# Patient Record
Sex: Female | Born: 1980 | Race: White | Hispanic: No | Marital: Single | Smoking: Current every day smoker
Health system: Southern US, Community
[De-identification: ages and names within clinical notes are randomized; demographics above are authoritative.]

## PROBLEM LIST (undated history)

## (undated) DIAGNOSIS — M25569 Pain in unspecified knee: Secondary | ICD-10-CM

## (undated) DIAGNOSIS — K219 Gastro-esophageal reflux disease without esophagitis: Secondary | ICD-10-CM

## (undated) DIAGNOSIS — G8929 Other chronic pain: Secondary | ICD-10-CM

## (undated) DIAGNOSIS — M069 Rheumatoid arthritis, unspecified: Secondary | ICD-10-CM

## (undated) DIAGNOSIS — R011 Cardiac murmur, unspecified: Secondary | ICD-10-CM

## (undated) HISTORY — PX: CHOLECYSTECTOMY: SHX55

## (undated) HISTORY — PX: ABDOMINAL HYSTERECTOMY: SHX81

## (undated) HISTORY — PX: TUBAL LIGATION: SHX77

## (undated) HISTORY — PX: FOOT SURGERY: SHX648

---

## 2000-12-26 ENCOUNTER — Inpatient Hospital Stay (HOSPITAL_COMMUNITY): Admission: AD | Admit: 2000-12-26 | Discharge: 2000-12-28 | Payer: Self-pay | Admitting: Obstetrics and Gynecology

## 2003-01-16 ENCOUNTER — Inpatient Hospital Stay (HOSPITAL_COMMUNITY): Admission: RE | Admit: 2003-01-16 | Discharge: 2003-01-18 | Payer: Self-pay | Admitting: Obstetrics and Gynecology

## 2003-07-24 ENCOUNTER — Emergency Department (HOSPITAL_COMMUNITY): Admission: EM | Admit: 2003-07-24 | Discharge: 2003-07-24 | Payer: Self-pay | Admitting: Emergency Medicine

## 2003-12-15 ENCOUNTER — Inpatient Hospital Stay (HOSPITAL_COMMUNITY): Admission: AD | Admit: 2003-12-15 | Discharge: 2003-12-18 | Payer: Self-pay | Admitting: *Deleted

## 2003-12-15 ENCOUNTER — Ambulatory Visit (HOSPITAL_COMMUNITY): Admission: AD | Admit: 2003-12-15 | Discharge: 2003-12-15 | Payer: Self-pay | Admitting: Obstetrics and Gynecology

## 2004-01-19 ENCOUNTER — Ambulatory Visit (HOSPITAL_COMMUNITY): Admission: RE | Admit: 2004-01-19 | Discharge: 2004-01-19 | Payer: Self-pay | Admitting: Internal Medicine

## 2006-04-03 ENCOUNTER — Ambulatory Visit (HOSPITAL_COMMUNITY): Admission: RE | Admit: 2006-04-03 | Discharge: 2006-04-03 | Payer: Self-pay | Admitting: Obstetrics & Gynecology

## 2006-04-19 ENCOUNTER — Encounter (INDEPENDENT_AMBULATORY_CARE_PROVIDER_SITE_OTHER): Payer: Self-pay | Admitting: Specialist

## 2006-04-19 ENCOUNTER — Inpatient Hospital Stay (HOSPITAL_COMMUNITY): Admission: RE | Admit: 2006-04-19 | Discharge: 2006-04-20 | Payer: Self-pay | Admitting: Obstetrics & Gynecology

## 2006-12-01 ENCOUNTER — Emergency Department (HOSPITAL_COMMUNITY): Admission: EM | Admit: 2006-12-01 | Discharge: 2006-12-01 | Payer: Self-pay | Admitting: Emergency Medicine

## 2006-12-26 ENCOUNTER — Emergency Department (HOSPITAL_COMMUNITY): Admission: EM | Admit: 2006-12-26 | Discharge: 2006-12-26 | Payer: Self-pay | Admitting: Emergency Medicine

## 2007-01-20 ENCOUNTER — Emergency Department (HOSPITAL_COMMUNITY): Admission: EM | Admit: 2007-01-20 | Discharge: 2007-01-20 | Payer: Self-pay | Admitting: Emergency Medicine

## 2007-03-01 ENCOUNTER — Emergency Department (HOSPITAL_COMMUNITY): Admission: EM | Admit: 2007-03-01 | Discharge: 2007-03-01 | Payer: Self-pay | Admitting: Emergency Medicine

## 2007-07-21 ENCOUNTER — Emergency Department (HOSPITAL_COMMUNITY): Admission: EM | Admit: 2007-07-21 | Discharge: 2007-07-21 | Payer: Self-pay | Admitting: Emergency Medicine

## 2007-10-21 ENCOUNTER — Emergency Department (HOSPITAL_COMMUNITY): Admission: EM | Admit: 2007-10-21 | Discharge: 2007-10-21 | Payer: Self-pay | Admitting: Emergency Medicine

## 2008-03-19 ENCOUNTER — Emergency Department (HOSPITAL_COMMUNITY): Admission: EM | Admit: 2008-03-19 | Discharge: 2008-03-19 | Payer: Self-pay | Admitting: Emergency Medicine

## 2008-03-22 ENCOUNTER — Emergency Department (HOSPITAL_COMMUNITY): Admission: EM | Admit: 2008-03-22 | Discharge: 2008-03-22 | Payer: Self-pay | Admitting: Emergency Medicine

## 2008-04-06 ENCOUNTER — Emergency Department (HOSPITAL_COMMUNITY): Admission: EM | Admit: 2008-04-06 | Discharge: 2008-04-06 | Payer: Self-pay | Admitting: Emergency Medicine

## 2008-04-16 ENCOUNTER — Inpatient Hospital Stay (HOSPITAL_COMMUNITY): Admission: RE | Admit: 2008-04-16 | Discharge: 2008-04-17 | Payer: Self-pay | Admitting: Obstetrics and Gynecology

## 2008-04-16 ENCOUNTER — Encounter: Payer: Self-pay | Admitting: Obstetrics and Gynecology

## 2008-05-25 ENCOUNTER — Emergency Department (HOSPITAL_COMMUNITY): Admission: EM | Admit: 2008-05-25 | Discharge: 2008-05-25 | Payer: Self-pay | Admitting: Emergency Medicine

## 2008-05-30 ENCOUNTER — Emergency Department (HOSPITAL_COMMUNITY): Admission: EM | Admit: 2008-05-30 | Discharge: 2008-05-30 | Payer: Self-pay | Admitting: Emergency Medicine

## 2009-04-09 ENCOUNTER — Emergency Department (HOSPITAL_COMMUNITY): Admission: EM | Admit: 2009-04-09 | Discharge: 2009-04-09 | Payer: Self-pay | Admitting: Emergency Medicine

## 2010-12-07 NOTE — Op Note (Signed)
Cathy [name], Cathy [name]             ACCOUNT NO.:  [id number]   MEDICAL RECORD NO.:  [id number]          PATIENT TYPE:  INP   LOCATION:  A308                          FACILITY:  [facility]   PHYSICIAN:  Tilda Burrow, M.D. DATE OF BIRTH:  January 03, 1981   DATE OF PROCEDURE:  04/16/2008  DATE OF DISCHARGE:                               OPERATIVE REPORT   PREOPERATIVE DIAGNOSIS:  Severe left lower quadrant pain secondary to  ovarian cyst.   POSTOPERATIVE DIAGNOSIS:  Severe left lower quadrant pain secondary to  ovarian cyst with small bowel adhesions to left ovary.   PROCEDURE:  Left salpingo-oophorectomy with lysis of small bowel  adhesions.   SURGEON:  Tilda Burrow, M.D.   ASSISTANTMorrie Sheldon, RN-FA.   ANESTHESIA:  General.   COMPLICATIONS:  None.   FINDINGS:  Multiple cysts in moderately enlarged left ovary with a loop  of small bowel under tension attached to the left tube and ovary.   DETAILS OF THE PROCEDURE:  Patient was taken to the operating room,  prepped and draped in the usual fashion for abdominal surgery.  A  Pfannenstiel incision performed after a time-out and appropriate  preoperative preparation, including IV antibiotics preoperative.  A  small 12 cm long incision was made with removing a small ellipse of  redundant skin, with opening of the fascia transversely in standard  Pfannenstiel technique and opening of the peritoneal cavity in the  midline.  During the initial part of the case, there was an inadvertant  1-2 mm puncture to the surgeon's left index finger, which was caught  immediately, and gloves changed.   The peritoneal cavity was opened, the pelvis inspected, and a loop of  bowel found under tension densely adherent to the left tube and ovary.  The first portion of the case was to focus on freeing up bowel and  careful, sharp dissection was performed to remove the adhesion from the  surface of the ovary.  There was no suspicion of injury to the bowel  surfaces.   The tube and ovary could then be grasped, elevated, and placed on  traction.  The round ligament remnants identified, clamped, cut, and  cauterized and released.  The adhesions from the remnants of the utero-  ovarian ligament to the side wall were released, and then the ovary  could be elevated up, and the infundibulopelvic ligament isolated,  confirmed as being well away from the ureter, clamped, then transected,  and specimen removed.  The pedicle was ligated with 0 chromic.  Hemostasis was confirmed.  Peristalsis of the ureter was notable.  The  pelvis was further inspected, and there was no adhesions of significance  in the pelvis.  The vaginal cuff felt smooth.  The right side was  normal.  The umbilicus was palpated internally.  There was no evidence  of a hernia, although there had been originally some question about a  periumbilical hernia noted during the prepping of the abdomen.  Once the  procedure was completed, the small flexible retractor was removed.  The  peritoneum closed with running 2-0 chromic, leaving some fluid  in the  abdomen, then the fascia closed using running 0 Vicryl.  Subcu tissues  were reapproximated using three interrupted sutures of 2-0 plain with  staple closure completing the procedure.  Sponge and needle counts were  correct.  EBL minimal.   ADDENDUM:  Patient received Marcaine injection into the left IP ligament  and into the corners of the fascial incision.  Analgesia postop was  quite good.      Tilda Burrow, M.D.  Electronically Signed     JVF/MEDQ  D:  04/16/2008  T:  04/16/2008  Job:  [id number]

## 2010-12-07 NOTE — Discharge Summary (Signed)
Cathy [name], Cathy [name] NO.:  [id number]   MEDICAL RECORD NO.:  [id number]          PATIENT TYPE:  INP   LOCATION:  A308                          FACILITY:  [facility]   PHYSICIAN:  Tilda Burrow, M.D. DATE OF BIRTH:  1981/03/23   DATE OF ADMISSION:  04/16/2008  DATE OF DISCHARGE:  09/24/2009LH                               DISCHARGE SUMMARY   ADMITTING DIAGNOSIS:  Severe pelvic pain secondary to left ovarian cyst,  rule out ovarian torsion, failed medical therapy.   DISCHARGE DIAGNOSIS:  Severe pelvic pain secondary to left ovarian cyst  and left ovarian adhesions to small bowel.   PROCEDURE:  April 16, 2008, left salpingo-oophorectomy, lysis of  small bowel adhesions.   DISCHARGE MEDICATIONS:  1. Percocet 5/325, 30 tablets 1 p.o. q.6 h. p.r.n. pain, 0 refills.  2. Vivelle-Dot 0.1 mg patch biweekly x1 year.  3. Phenergan 25 mg tablet 10 tablets 1 p.o. q.6 h. p.r.n. nausea      associated with pain medicines.   HOSPITAL SUMMARY:  The patient was admitted on April 16, 2008, for  laparotomy and left salpingo-oophorectomy.  See HPI for admitting  symptoms which are primarily of incapacitating pain of progressive  nature, 10 months, acutely worse for the past 4 weeks with a large  ovarian cyst noted on ultrasound and the pain not resolving.  The  surgery was notable for loop of small bowel being adherent to the  surface of the enlarged ovary and likely being placed under tension as  the ovary enlarged and descended into the pelvis.  Left salpingo-  oophorectomy was performed.  The patient was status post previous TVH-  RSO.  Postoperatively, the patient did well with minimal blood loss at  the time of surgery.  Hematocrit dropped from 36 to 32 with  postoperative hydration.  Vital signs, afebrile.  Blood pressure in the  110 to 120 systolic over 60 diastolic and pulse in the 60s.  She  tolerated regular diet and was discharged the following morning postop  day 1.  Followup 5 days for staple removal with routine postsurgical  instructions given.   ADDENDUM   Pathology report pending.      Tilda Burrow, M.D.  Electronically Signed     JVF/MEDQ  D:  04/17/2008  T:  04/17/2008  Job:  [id number]
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
## 2010-12-10 NOTE — Op Note (Signed)
NAME:  Cathy [name], Cathy [name].                      ACCOUNT NO.:  [id number]   MEDICAL RECORD NO.:  [id number]                  PATIENT TYPE:   LOCATION:                                       FACILITY:   PHYSICIAN:  Tilda Burrow, M.D.              DATE OF BIRTH:   DATE OF PROCEDURE:  01/19/2004  DATE OF DISCHARGE:                                 OPERATIVE REPORT   PREOPERATIVE DIAGNOSIS:  1. Elective sterilization.  2. Periumbilical homemade tattoo.   POSTOPERATIVE DIAGNOSIS:  1. Elective sterilization.  2. Periumbilical homemade tattoo.   PROCEDURE:  1. Laparoscopic tubal sterilization with Falope rings.  2. Excision of skin lesion (tattoo).   SURGEON:  Christin Bach, M.D.   ASSISTANTMarlana Salvage, CST   ANESTHESIA:  General.   COMPLICATIONS:  None.   FINDINGS:  Homemade tattoo in subumbilical area requested to be removed by  patient, normal tubes and ovaries bilaterally.   ESTIMATED BLOOD LOSS:  Minimal.   DETAILS OF PROCEDURE:  The patient was taken to the operating room, prepped  and draped in the usual standard fashion with legs in low lithotomy leg  supports after general anesthesia was introduced without difficulty.  The  bladder was in-and-out catheterized and Hulka tenaculum attached to the  cervix for uterine  manipulation.  An infraumbilical, vertical, 1-cm, skin  incision was made as well as a transverse suprapubic 1-cm incision. A Veress  needle was used to achieve pneumoperitoneum through the umbilical incision  while being careful to orient the needle toward the pelvis while elevating  the abdominal wall by manual elevation. Water droplet test was used to  confirm intraperitoneal placement.   Pneumoperitoneum was achieved easily under 8-to-10 mm of intra-abdominal  pressure; and the laparoscopic trocar was introduced, a 5-mm blunt tipped  trocar, under direct visualization using the video camera.  Peritoneal  cavity was entered without difficulty.   Inspection of the anterior surfaces  of the abdominal contents showed no evidence of injury or bleeding.  Attention was directed to the pelvis.  Findings were as described above.   Attention was first directed to the left fallopian tube which was elevated,  identified to its fimbriated end and grasped in its midportion with Falope  ring applier.  Falope ring applied and then the tube infiltrated with  Marcaine solution 0.25% using a 22-gauge spinal needle percutaneously  applied.   Attention was then directed to the right fallopian tube where a similar  procedure was performed.  Photo documentation of the ring placements was  performed; 120 cc of saline was instilled into the abdomen; deflation of  CO2 performed; instruments removed and subcuticular 4-0 Dexon closure of  skin incisions performed.  The rest of the surgical instruments were  removed; Steri-Strips placed.  The patient allowed to awaken and go to  recovery room in standard fashion.   ADDENDUM:  As a portion of the procedure the subumbilical incision was a  vertical 2.5 cm incision which removed an ellipse of skin incorporating a  homemade tattoo approximately 1.5 cm in maximum diameter.  This was set off  and discarded due to its benign nature; and closure of this, at the end of  the procedure, consisted of a 2-layer closure of subcu 4-0 Dexon after 2  deeper sutures of 4-0 Dexon as well.  Cosmetic reapproximation was good.  Steri-Strips were applied.      ___________________________________________                                            Tilda Burrow, M.D.   JVF/MEDQ  D:  01/19/2004  T:  01/19/2004  Job:  [id number]   cc:   Tilda Burrow, M.D.  [address] [place]  [place] [address]  Fax: [phone]

## 2010-12-10 NOTE — Op Note (Signed)
NAMECARA, [name] NO.:  [id number]   MEDICAL RECORD NO.:  [id number]          PATIENT TYPE:  INP   LOCATION:  A417                          FACILITY:  [facility]   PHYSICIAN:  Lazaro Arms, M.D.   DATE OF BIRTH:  [birth year]   DATE OF PROCEDURE:  04/19/2006  DATE OF DISCHARGE:  04/20/2006                                 OPERATIVE REPORT   PREOPERATIVE DIAGNOSES:  1. Menometrorrhagia.  2. Dysmenorrhea.  3. Dyspareunia.   POSTOPERATIVE DIAGNOSES:  1. Menometrorrhagia.  2. Dysmenorrhea.  3. Dyspareunia.   PROCEDURE:  Total vaginal hysterectomy, right salpingo-oophorectomy.   SURGEON:  Lazaro Arms, M.D.   ANESTHESIA:  General endotracheal.   FINDINGS:  Patient had uterus probably consistent with adenomyosis.  It was  very boggy.  Left ovary and tube within normal limits, as was the right  ovary, but she was having pain in that right side chronically as well.   DESCRIPTION OF OPERATION:  Patient was taken to the operating room, placed  in a supine position.  Underwent general endotracheal anesthesia.  She was  placed in the dorsal lithotomy position in candy-cane stirrups.  She was  prepped and draped in the usual sterile fashion.  A Foley catheter was  placed.  A weighted speculum was placed in the posterior vagina.  Marcaine  0.5% with 1:200,000 epinephrine, 14 cc were used to inject around the cervix  to provide some intraoperative hemostasis.  The electrocautery unit was then  used, and the cervix was incised circumferentially using the electrocautery  unit.  The anterior and posterior vagina were pushed off the lower uterine  segment without difficulty.  The posterior vagina was entered sharply, and  the __________ weighted speculum was then placed.  The uterosacral ligaments  were clamped, cut, and transection suture ligated and held.  The cardinal  ligaments were then clamped, cut, transfixed, and suture ligated and held.  The anterior cul de sac  was entered without difficulty.  The anterior and  posterior leaflets of the broad ligament were plicated.  The vessels were  clamped, cut, and suture-ligated.  Serial pedicles were taken up the fundus,  each pedicle being clamped, cut, and ligated.  The left utero-ovarian  ligament was crossed, clamped, cut, and double suture ligated.  The right  utero-ovarian ligament was crossed, clamped, cut, and single suture ligated.  The infundibulopelvic ligament on the right was then crossed, clamped, cut,  and double suture ligated with good hemostasis.  The pelvis was irrigated  vigorously.  All pedicles were found to be hemostatic.  The left ovary was  wrapped in Interceed and pushed back up  into the peritoneal cavity.  The peritoneum was closed in a purse-string  fashion.  The vagina was closed anterior to posterior in an interrupted  fashion with good hemostasis resulting.  Irrigation was used.  All pedicles  found to be hemostatic.  Estimated blood loss 200 cc.  She received Ancef  prophylactically.      Lazaro Arms, M.D.  Electronically Signed     LHE/MEDQ  D:  04/19/2006  T:  04/20/2006  Job:  [id number]

## 2010-12-10 NOTE — Op Note (Signed)
   NAME:  Cathy [name], Cathy [name]                       ACCOUNT NO.:  [id number]   MEDICAL RECORD NO.:  [id number]                   PATIENT TYPE:  INP   LOCATION:  LDR1                                 FACILITY:  [facility]   PHYSICIAN:  Lazaro Arms, M.D.                DATE OF BIRTH:  [birth year]   DATE OF PROCEDURE:  DATE OF DISCHARGE:                                 OPERATIVE REPORT   DELIVERY NOTE:  Cathy [name] was noted to be fully dilated at +3 station  at approximately 1018.  She delivered a viable female infant over 1 push at  1021. The mouth and nose were suctioned on the perineum.  There was a tight  nuchal cord which was nonreducible so the baby was delivered through it.  Weight is 6 pounds 9 ounces. Apgars are 8 and 9.   Twenty units of Pitocin diluted in 1000 cc of lactated Ringers was infused  rapidly IV after the delivery of an intact placenta at 1025.  The vagina was  then inspected and no lacerations were found.  The epidural catheter was  removed with the blue tip visualized as being intact.     Jacklyn Shell, C.N.M.          Lazaro Arms, M.D.    FC/MEDQ  D:  01/16/2003  T:  01/16/2003  Job:  [id number]

## 2010-12-10 NOTE — H&P (Signed)
   NAME:  Cathy [name], Cathy [name]                       ACCOUNT NO.:  [id number]   MEDICAL RECORD NO.:  [id number]                   PATIENT TYPE:  OIB   LOCATION:  A414                                 FACILITY:  [facility]   PHYSICIAN:  Lazaro Arms, M.D.                DATE OF BIRTH:  11-13-1980   DATE OF ADMISSION:  01/16/2003  DATE OF DISCHARGE:                                HISTORY & PHYSICAL   HISTORY OF PRESENT ILLNESS:  Cathy [name] is a 30 year old gravida 4, para 1,  who is admitted this morning on January 16, 2003, for early active labor.  Cathy [name] started having contractions last night at 10:30 that became  stronger during the course of early morning.  She came to the hospital  having regular contractions.  At that time she was 3 cm, thick, and -1.  She  is now 4 cm, thin, bulging membranes, and irritation.   Medical history is positive for a heart murmur as a child.  Surgical history  is positive for D&C x2 and left foot surgery.   ALLERGIES:  No known allergies.   MEDICATIONS:  She is on prenatal vitamins.   SOCIAL HISTORY:  She is married.  Her husband is present and supportive.   FAMILY HISTORY:  Positive for hypertension and coronary artery disease.   Prenatal course is uneventful.  Blood type is A positive.  UDS is negative.  Rubella is immune.  Hepatitis B surface antigen is negative, HIV is  negative.  Serology is nonreactive.  GC and Chlamydia were normal.  Pap was  within normal limits.  She is prior GBS-positive.  We will start ampicillin  for prophylaxis.  Twenty-eight week hemoglobin 11.4, twenty-eight week  hematocrit 34.4.  One-hour glucose 68.   PHYSICAL EXAMINATION:  VITAL SIGNS:  Stable.  PELVIC:  Cervix 4 cm, completely effaced, membranes are bulging, 0 station.  Fetus is stable with accelerations noted.   PLAN:  Admit.  Expect vaginal delivery.     Zerita Boers, N.M.                      Lazaro Arms, M.D.    DL/MEDQ  D:  40/98/1191  T:  01/16/2003   Job:  [id number]

## 2010-12-10 NOTE — H&P (Signed)
NAME:  Cathy [name], Cathy [name]                       ACCOUNT NO.:  [id number]   MEDICAL RECORD NO.:  [id number]                   PATIENT TYPE:  INP   LOCATION:  A428                                 FACILITY:  [facility]   PHYSICIAN:  Tilda Burrow, M.D.              DATE OF BIRTH:  1981/05/15   DATE OF ADMISSION:  12/15/2003  DATE OF DISCHARGE:                                HISTORY & PHYSICAL   ADMITTING DIAGNOSIS:  Pregnancy, 39 weeks' gestation, late in phase labor.   HISTORY OF PRESENT ILLNESS:  This 30 year old female, gravida 5, para 2, AB  2, LMP March 18, 2003, placing menstrual ADC Dec 23, 2003, is admitted on  the evening of Dec 15, 2003 after prenatal course followed through our  office since May 04, 2004.  Prenatal course was uneventful, except for  cholelithiasis noted during the pregnancy with the patient having a  laparoscopic cholecystectomy performed July 23, 2003.  She had dental  abscesses in January, which required penicillin and responded to therapy.  The patient's fundal height growth was on the small side, but considered  appropriate and infant remained active.  The patient requested permanent  sterilization papers and signed them November 19, 2003.  She is admitted for  labor management.   PAST MEDICAL HISTORY:  Benign.   PAST SURGICAL HISTORY:  1. Foot surgery.  2. D&C times two.  3. Laparoscopic cholecystectomy July 23, 2003 by Dr. Cleotis Nipper.   HABITS:  1. Cigarettes, six per day.  2. Alcohol and recreational drugs denied.   ALLERGIES:  None.   SOCIAL HISTORY:  She lives with Royce Macadamia, 34.  This is her sixth child.   PHYSICAL EXAMINATION:  GENERAL:  She is a thin, healthy appearing Caucasian  female, comfortable, alert and oriented times three.  ABDOMEN:  Term sized fetus, estimated fetal weight 6 pounds.  Cervix 3 cm  between contractions, 75% phase, -2 station vertex with amniotomy revealing  clear fluid.   Blood type A+.  Urine drug  screen negative.  Hemoglobin 13, hematocrit 41.  Hepatitis, HIV, GC, chlamydia, RPR all negative.  MSAFP negative at 1 and  3,900.   PLAN:  __________ labor epidural planned and __________ labor required.  Additionally, the patient has a history of group B Strep with prior  pregnancies and will be treated for group B Strep.  Expectant management.  Good prognosis for vaginal delivery.     ___________________________________________                                         Tilda Burrow, M.D.   JVF/MEDQ  D:  12/16/2003  T:  12/16/2003  Job:  [id number]   cc:   Tilda Burrow, M.D.  [address] [place]  [place] [address]  Fax: [phone]   Aurther Loft  Reuel Boom  [address]  [place]  [place] [address]  Fax: [phone]   Francoise Schaumann. Halm, D.O.  [address]  [place]  [place] [address]  Fax: [phone]

## 2010-12-10 NOTE — Discharge Summary (Signed)
Cathy [name], Cathy [name] NO.:  [id number]   MEDICAL RECORD NO.:  [id number]          PATIENT TYPE:  INP   LOCATION:  A417                          FACILITY:  [facility]   PHYSICIAN:  Lazaro Arms, M.D.   DATE OF BIRTH:  02-23-1981   DATE OF ADMISSION:  04/19/2006  DATE OF DISCHARGE:  09/27/2007LH                                 DISCHARGE SUMMARY   DISCHARGE DIAGNOSES:  1. Status post total vaginal hysteroscopy/right salpingo-oophorectomy.  2. Unremarkable postoperative course.   PROCEDURE:  The patient was admitted after surgery. Please see the history  and physical and operative note for details. The patient underwent routine  postoperative course, tolerated clear liquids and a regular diet, voided  without symptoms and was ambulatory. She tolerated transition from IV to  oral pain medicine. Her abdominal exam was benign. Her hemoglobin and  hematocrit postoperative were excellent. She was discharged to home on the  morning of postoperative day #1 in good stable condition to follow up in the  office next week to be evaluated.      Lazaro Arms, M.D.  Electronically Signed     LHE/MEDQ  D:  05/18/2006  T:  05/19/2006  Job:  [id number]

## 2010-12-10 NOTE — Op Note (Signed)
NAME:  Cathy [name], Cathy [name]                       ACCOUNT NO.:  [id number]   MEDICAL RECORD NO.:  [id number]                   PATIENT TYPE:  INP   LOCATION:  A410                                 FACILITY:  [facility]   PHYSICIAN:  Tilda Burrow, M.D.              DATE OF BIRTH:  [birth year]   DATE OF PROCEDURE:  12/16/2003  DATE OF DISCHARGE:                                  PROCEDURE NOTE   EPIDURAL NOTE:  Shortly after midnight, the patient requested epidural  having developed a mild spontaneous contraction pattern and reached 3 cm at  rest, 4 cm with contraction.  The patient was placed in sitting position,  flexed forward and single, and the back was prepped and draped in standard  fashion for epidural catheter placement.  Skin wheal was raised.  A 19 gauge  Tuohy needle used to identify the epidural space using loss of resistance  technique.  The epidural space was identified on the first attempt at a  depth of approximately 4 cm.  The test dose of 1.5% Xylocaine with  epinephrine was instilled times 5 mL, the epidural catheter was threaded 4  cm into the epidural space.  The Tuohy needle removed and the catheter taped  to the patient's back.  The continuous infusion was set up with a 10 mL  bolus administered of the 0.125% Marcaine solution with excellent analgesia  to T10 level of pain.  The patient had continuous infusion at 12 mL per hour  thereafter with good effect.  Pitocin was initiated and amniotomy performed.   DELIVERY NOTE:  The patient progressed to completely dilated in two hours  going from 6 to 10 cm in 10 minutes.  She pushed two times and delivered a  healthy female infant, Apgars 9 and 9 over an intact perineum from a direct  O.A. position. The infant's cord was clamped and then the baby's father cut  the cord.  The infant was placed in the warmer.  The placenta was expelled  spontaneously.  Blood loss was minimal.  Infant weight was 6 pounds 2  ounces.  The perineum  was inspected.  No lacerations warranting repair were  noted.  The patient was allowed to sit up and epidural catheter with tip  visualized as intact.      ___________________________________________                                            Tilda Burrow, M.D.   JVF/MEDQ  D:  12/16/2003  T:  12/17/2003  Job:  [id number]   cc:   Donzetta Sprung  [address], [address]  [place]  [place] [address]  Fax: [phone]   Francoise Schaumann. Halm, D.O.  [address], [address]  [place]  [place] [address]  Fax: [phone]

## 2010-12-10 NOTE — H&P (Signed)
NAME:  Cathy [name], Cathy [name]                       ACCOUNT NO.:  [id number]   MEDICAL RECORD NO.:  [id number]                   PATIENT TYPE:  AMB   LOCATION:  DAY                                  FACILITY:  [facility]   PHYSICIAN:  Tilda Burrow, M.D.              DATE OF BIRTH:  [birth year]   DATE OF ADMISSION:  DATE OF DISCHARGE:                                HISTORY & PHYSICAL   ADMISSION DIAGNOSIS:  Desire for elective permanent sterilization.   HISTORY OF PRESENT ILLNESS:  This 30 year old female was admitted at this  time for elective permanent sterilization. Cathy [name] returned to our office  desiring elective permanent sterilization. She is a gravida 5, para 3 who  last child was delivered one month ago. She is emphatic in her desire for  permanent sterilization. She has 3 children, ages 3 years, 1 year, and 1  month. Family is supportive of permanent sterilization. She signed Medicaid  sterilization forms two months ago. She reaffirms desire for permanent  sterilization. 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   PAST MEDICAL HISTORY:  Benign.   PAST SURGICAL HISTORY:  1. Foot surgery.  2. D&C x2.  3. Laparoscopic cholecystectomy December 2004.   HABITS:  Cigarettes one half pack per day. Alcohol recreational. Drugs  denied.   ALLERGIES:  None.   MEDICATIONS:  Lexapro 10 mg p.o. q.d. converted to Effexor 37.5 q.h.s.  within the last month.   PHYSICAL EXAMINATION:  GENERAL:  Shows a healthy appearing Caucasian female.  Height 5 feet 3, weight 134. Blood pressure 110/72.  HEENT:  Pupils are equal, round, and reactive to light.  NECK:  Supple. Trachea midline.  CHEST:  Clear to auscultation.  CARDIOVASCULAR:  Unremarkable.  ABDOMEN:  Nontender with homemade tattoo just below the umbilicus and a  heart-shaped circle around a J in the left lower quadrant. She requests that  we use the subumbilical homemade  tattoo site as her entry port and remove  the tattoo mark just below the navel. She understands that no effort will be  made to excise the left lower quadrant large tattoo.  EXTERNAL GENITALIA:  Normal, multiparous.  VAGINAL EXAM:  Normal secretions. Cervix ______________ . Uterus anteflexed.  Adnexa negative for masses.  EXTREMITIES:  Grossly normal.   IMPRESSION:  Desire for elective permanent sterilization, homemade tattoo  subumbilical site.   PLAN:  Laparoscopic tubal sterilization, Falope rings, and excision of the  subumbilical tattoo as part of the access site.     ___________________________________________                                         Tilda Burrow, M.D.   JVF/MEDQ  D:  01/19/2004  T:  01/19/2004  Job:  [id number]

## 2010-12-10 NOTE — Op Note (Signed)
   NAME:  Cathy [name], DETERT                       ACCOUNT NO.:  [id number]   MEDICAL RECORD NO.:  [id number]                   PATIENT TYPE:  INP   LOCATION:  LDR1                                 FACILITY:  [facility]   PHYSICIAN:  Lazaro Arms, M.D.                DATE OF BIRTH:  10/25/1980   DATE OF PROCEDURE:  01/16/2003  DATE OF DISCHARGE:                                 OPERATIVE REPORT   PROCEDURE:  Epidural placement note.   INDICATIONS:  The patient is a gravid 3, para 2, at term, 5 cm dilated,  status post IV pain medicine requesting an epidural be placed.   DESCRIPTION OF PROCEDURE:  She was placed in the sitting position.  The  iliac crests were identified and the L2-3 interspace was identified.  Betadine prep was used and sterile drape placed.  One percent lidocaine was  used as local anesthetic.  A 17-gauge Tuohy needle was placed and loss-of-  resistance technique employed.  The epidural space was found with one pass  without difficulty.  Bupivacaine plain 0.125% , 10 mL, was given as a test  dose without ill effects.  The epidural catheter was fed 5 cm into the  epidural space and taped down at that point.  Additional 10 mL was given of  0.125% bupivacaine was given.  The patient has no adverse effects.  Blood  pressure is stable. Fetal heart rate stable.  The patient is beginning to  get relief.                                               Lazaro Arms, M.D.    Loraine Maple  D:  01/16/2003  T:  01/16/2003  Job:  [id number]

## 2011-04-18 LAB — WET PREP, GENITAL
Trich, Wet Prep: NONE SEEN
WBC, Wet Prep HPF POC: NONE SEEN
Yeast Wet Prep HPF POC: NONE SEEN

## 2011-04-18 LAB — URINALYSIS, ROUTINE W REFLEX MICROSCOPIC
Bilirubin Urine: NEGATIVE
Glucose, UA: NEGATIVE
Hgb urine dipstick: NEGATIVE
Nitrite: NEGATIVE
Protein, ur: NEGATIVE
Specific Gravity, Urine: 1.025
Urobilinogen, UA: 0.2
pH: 5.5

## 2011-04-18 LAB — GC/CHLAMYDIA PROBE AMP, GENITAL
Chlamydia, DNA Probe: NEGATIVE
GC Probe Amp, Genital: NEGATIVE

## 2011-04-18 LAB — RPR: RPR Ser Ql: NONREACTIVE

## 2011-04-18 LAB — PREGNANCY, URINE: Preg Test, Ur: NEGATIVE

## 2011-04-25 LAB — CBC
HCT: 33.7 — ABNORMAL LOW
HCT: 39.3
Hemoglobin: 11.6 — ABNORMAL LOW
Hemoglobin: 13.4
MCHC: 34.2
MCHC: 34.5
MCV: 94.3
MCV: 95.1
Platelets: 195
Platelets: 216
RBC: 3.54 — ABNORMAL LOW
RBC: 4.17
RDW: 13
RDW: 13.4
WBC: 7.3
WBC: 7.9

## 2011-04-25 LAB — DIFFERENTIAL
Basophils Absolute: 0
Basophils Relative: 0
Eosinophils Absolute: 0.1
Eosinophils Relative: 1
Lymphocytes Relative: 32
Lymphs Abs: 2.5
Monocytes Absolute: 0.7
Monocytes Relative: 8
Neutro Abs: 4.6
Neutrophils Relative %: 58

## 2011-04-25 LAB — BASIC METABOLIC PANEL
BUN: 11
CO2: 29
Calcium: 9.5
Chloride: 103
Creatinine, Ser: 0.79
GFR calc Af Amer: >60
GFR calc non Af Amer: >60
Glucose, Bld: 92
Potassium: 4.3
Sodium: 139

## 2011-08-25 ENCOUNTER — Emergency Department (HOSPITAL_COMMUNITY)
Admission: EM | Admit: 2011-08-25 | Discharge: 2011-08-25 | Disposition: A | Discharge status: Home / Self Care | Payer: Self-pay | Attending: Emergency Medicine | Admitting: Emergency Medicine

## 2011-08-25 ENCOUNTER — Encounter (HOSPITAL_COMMUNITY): Payer: Self-pay | Admitting: Emergency Medicine

## 2011-08-25 DIAGNOSIS — R05 Cough: Secondary | ICD-10-CM | POA: Insufficient documentation

## 2011-08-25 DIAGNOSIS — Z9851 Tubal ligation status: Secondary | ICD-10-CM | POA: Insufficient documentation

## 2011-08-25 DIAGNOSIS — R6889 Other general symptoms and signs: Secondary | ICD-10-CM | POA: Insufficient documentation

## 2011-08-25 DIAGNOSIS — H5710 Ocular pain, unspecified eye: Secondary | ICD-10-CM | POA: Insufficient documentation

## 2011-08-25 DIAGNOSIS — R609 Edema, unspecified: Secondary | ICD-10-CM | POA: Insufficient documentation

## 2011-08-25 DIAGNOSIS — F17200 Nicotine dependence, unspecified, uncomplicated: Secondary | ICD-10-CM | POA: Insufficient documentation

## 2011-08-25 DIAGNOSIS — Z9889 Other specified postprocedural states: Secondary | ICD-10-CM | POA: Insufficient documentation

## 2011-08-25 DIAGNOSIS — J3489 Other specified disorders of nose and nasal sinuses: Secondary | ICD-10-CM | POA: Insufficient documentation

## 2011-08-25 DIAGNOSIS — R070 Pain in throat: Secondary | ICD-10-CM | POA: Insufficient documentation

## 2011-08-25 DIAGNOSIS — R059 Cough, unspecified: Secondary | ICD-10-CM | POA: Insufficient documentation

## 2011-08-25 DIAGNOSIS — Z9079 Acquired absence of other genital organ(s): Secondary | ICD-10-CM | POA: Insufficient documentation

## 2011-08-25 DIAGNOSIS — R0789 Other chest pain: Secondary | ICD-10-CM | POA: Insufficient documentation

## 2011-08-25 HISTORY — DX: Gastro-esophageal reflux disease without esophagitis: K21.9

## 2011-08-25 MED ORDER — TOBRAMYCIN 0.3 % OP SOLN
OPHTHALMIC | Status: AC
  Filled 2011-08-25: qty 5

## 2011-08-25 MED ORDER — AZITHROMYCIN 250 MG PO TABS: ORAL_TABLET | ORAL | Status: DC

## 2011-08-25 MED ORDER — GUAIFENESIN-CODEINE 100-10 MG/5ML PO SYRP: 10.0000 mL | ORAL_SOLUTION | Freq: Three times a day (TID) | ORAL | Status: AC | PRN

## 2011-08-25 MED ORDER — ALBUTEROL SULFATE HFA 108 (90 BASE) MCG/ACT IN AERS
2.0000 | INHALATION_SPRAY | Freq: Once | RESPIRATORY_TRACT | Status: AC
  Administered 2011-08-25: 2 via RESPIRATORY_TRACT
  Filled 2011-08-25: qty 6.7

## 2011-08-25 MED ORDER — TOBRAMYCIN 0.3 % OP SOLN
2.0000 [drp] | Freq: Once | OPHTHALMIC | Status: AC
  Administered 2011-08-25: 2 [drp] via OPHTHALMIC

## 2011-08-25 MED ORDER — DOXYCYCLINE HYCLATE 100 MG PO CAPS: 100.0000 mg | ORAL_CAPSULE | Freq: Two times a day (BID) | ORAL | Status: AC

## 2011-08-25 NOTE — ED Notes (Signed)
Pt also noticed that lt eye lid swollen and tender,  Med given

## 2011-08-25 NOTE — ED Notes (Signed)
Alert, NAd,sick for several days with nasal congestion , cough,

## 2011-08-25 NOTE — ED Notes (Signed)
Pt states she has had nasal, head and chest congestion x 3 days.  Cough that is non productive for the most part but did have one episode of productive cough.

## 2011-08-25 NOTE — ED Provider Notes (Signed)
History     CSN: [id number]  Arrival date & time 08/25/11  1127   First MD Initiated Contact with Patient 08/25/11 1144      Chief Complaint  Patient presents with  . Nasal Congestion  . Cough  . Sore Throat    (Consider location/radiation/quality/duration/timing/severity/associated sxs/prior treatment) Patient is a 31 y.o. female presenting with cough. The history is provided by the patient. No language interpreter was used.  Cough This is a new problem. The current episode started more than 2 days ago. The problem occurs every few minutes. The problem has not changed since onset.The cough is productive of purulent sputum. There has been no fever. Associated symptoms include rhinorrhea. Pertinent negatives include no chest pain, no chills, no ear pain, no headaches, no sore throat, no myalgias, no shortness of breath and no wheezing. Associated symptoms comments: Nasal congestion , tenderness and swelling of the left upper eyelid. She has tried nothing for the symptoms. The treatment provided no relief. She is a smoker. Her past medical history does not include pneumonia or asthma.    Past Medical History  Diagnosis Date  . GERD (gastroesophageal reflux disease)     Past Surgical History  Procedure Date  . Abdominal hysterectomy   . Cholecystectomy   . Tubal ligation     History reviewed. No pertinent family history.  History  Substance Use Topics  . Smoking status: Current Everyday Smoker -- 0.5 packs/day    Types: Cigarettes  . Smokeless tobacco: Not on file  . Alcohol Use: No    OB History    Grav Para Term Preterm Abortions TAB SAB Ect Mult Living                  Review of Systems  Constitutional: Negative for chills, activity change and appetite change.  HENT: Positive for congestion, rhinorrhea and sneezing. Negative for ear pain, sore throat and trouble swallowing.   Eyes: Positive for pain.  Respiratory: Positive for cough and chest tightness. Negative  for shortness of breath, wheezing and stridor.   Cardiovascular: Negative for chest pain and palpitations.  Gastrointestinal: Negative for vomiting, abdominal pain and diarrhea.  Musculoskeletal: Negative.  Negative for myalgias.  Skin: Negative.   Neurological: Negative for dizziness and headaches.  Psychiatric/Behavioral: Negative for confusion.  All other systems reviewed and are negative.    Allergies  Review of patient's allergies indicates no known allergies.  Home Medications  No current outpatient prescriptions on file.  BP 116/75  Pulse 82  Temp(Src) 98.6 F (37 C) (Oral)  Resp 18  Ht 5' (1.524 m)  Wt 115 lb (52.164 kg)  BMI 22.46 kg/m2  SpO2 98%  Physical Exam  Nursing note and vitals reviewed. Constitutional: She is oriented to person, place, and time. She appears well-developed and well-nourished. No distress.  HENT:  Head: Normocephalic and atraumatic.  Right Ear: Tympanic membrane and ear canal normal.  Left Ear: Tympanic membrane and ear canal normal.  Nose: Mucosal edema present.  Mouth/Throat: Uvula is midline, oropharynx is clear and moist and mucous membranes are normal. No oropharyngeal exudate or tonsillar abscesses.  Eyes: Conjunctivae and EOM are normal. Pupils are equal, round, and reactive to light. No foreign bodies found. Left eye exhibits hordeolum. Left eye exhibits no chemosis, no discharge and no exudate. No foreign body present in the left eye.         Localized erythema and mils edema of the left upper lid.  Appears c/w hordeolum  Neck:  Normal range of motion. Neck supple.  Cardiovascular: Normal rate, regular rhythm and normal heart sounds.   No murmur heard. Pulmonary/Chest: Effort normal. No respiratory distress. She exhibits no tenderness.       Coarse lung sounds bilaterally.  No rales.  Musculoskeletal: Normal range of motion. She exhibits no tenderness.  Lymphadenopathy:    She has no cervical adenopathy.  Neurological: She is  alert and oriented to person, place, and time. No cranial nerve deficit. She exhibits normal muscle tone. Coordination normal.  Skin: Skin is warm and dry.    ED Course  Procedures (including critical care time)       MDM      Patient is alert and oriented. She appears nontoxic. Coarse lung sounds bilaterally with no wheezes or rales.  No tachypnea tachycardia or hypoxia to suggest PE.  Will treat cough with albuterol, antibiotics, and cough syrup. Patient agrees to close followup with her primary care physician or to return here if symptoms are worse.    Viktor Philipp L. [place], [place] 08/25/11 2029

## 2011-08-27 NOTE — ED Provider Notes (Signed)
Medical screening examination/treatment/procedure(s) were performed by non-physician practitioner and as supervising physician I was immediately available for consultation/collaboration.  Shantia Sanford L Iva Montelongo, MD 08/27/11 0944 

## 2011-09-23 ENCOUNTER — Encounter (HOSPITAL_COMMUNITY): Payer: Self-pay | Admitting: *Deleted

## 2011-09-23 ENCOUNTER — Emergency Department (HOSPITAL_COMMUNITY)
Admission: EM | Admit: 2011-09-23 | Discharge: 2011-09-23 | Disposition: A | Discharge status: Home / Self Care | Payer: Self-pay | Attending: Emergency Medicine | Admitting: Emergency Medicine

## 2011-09-23 DIAGNOSIS — H05012 Cellulitis of left orbit: Secondary | ICD-10-CM

## 2011-09-23 DIAGNOSIS — L02419 Cutaneous abscess of limb, unspecified: Secondary | ICD-10-CM

## 2011-09-23 DIAGNOSIS — IMO0002 Reserved for concepts with insufficient information to code with codable children: Secondary | ICD-10-CM | POA: Insufficient documentation

## 2011-09-23 DIAGNOSIS — F17200 Nicotine dependence, unspecified, uncomplicated: Secondary | ICD-10-CM | POA: Insufficient documentation

## 2011-09-23 DIAGNOSIS — H05019 Cellulitis of unspecified orbit: Secondary | ICD-10-CM | POA: Insufficient documentation

## 2011-09-23 MED ORDER — DOXYCYCLINE HYCLATE 100 MG PO CAPS: 100.0000 mg | ORAL_CAPSULE | Freq: Two times a day (BID) | ORAL | Status: AC

## 2011-09-23 MED ORDER — HYDROCODONE-ACETAMINOPHEN 5-325 MG PO TABS
1.0000 | ORAL_TABLET | Freq: Once | ORAL | Status: AC
  Administered 2011-09-23: 1 via ORAL

## 2011-09-23 MED ORDER — TOBRAMYCIN 0.3 % OP SOLN: 2.0000 [drp] | Freq: Once | OPHTHALMIC | Status: DC

## 2011-09-23 MED ORDER — DOXYCYCLINE HYCLATE 100 MG PO TABS
100.0000 mg | ORAL_TABLET | Freq: Once | ORAL | Status: AC
  Administered 2011-09-23: 100 mg via ORAL

## 2011-09-23 MED ORDER — HYDROCODONE-ACETAMINOPHEN 5-325 MG PO TABS: 1.0000 | ORAL_TABLET | Freq: Four times a day (QID) | ORAL | Status: AC | PRN

## 2011-09-23 NOTE — ED Notes (Signed)
Lt eye swollen , red x 5 days,  Knot rt axilla

## 2011-09-23 NOTE — ED Provider Notes (Signed)
History     CSN: [id number]  Arrival date & time 09/23/11  1258   None     Chief Complaint  Patient presents with  . Facial Swelling    (Consider location/radiation/quality/duration/timing/severity/associated sxs/prior treatment) HPI Comments: Over the past week pt has noted swelling to R axilla and to L inferior eyelid..  Axillary lesion now draining.  The history is provided by the patient. No language interpreter was used.    Past Medical History  Diagnosis Date  . GERD (gastroesophageal reflux disease)     Past Surgical History  Procedure Date  . Abdominal hysterectomy   . Cholecystectomy   . Tubal ligation     History reviewed. No pertinent family history.  History  Substance Use Topics  . Smoking status: Current Everyday Smoker -- 0.5 packs/day    Types: Cigarettes  . Smokeless tobacco: Not on file  . Alcohol Use: No    OB History    Grav Para Term Preterm Abortions TAB SAB Ect Mult Living                  Review of Systems  Constitutional: Negative for fever and chills.  Eyes: Positive for pain.  Skin: Positive for wound.       Axillary abscess  All other systems reviewed and are negative.    Allergies  Review of patient's allergies indicates no known allergies.  Home Medications  No current outpatient prescriptions on file.  BP 113/68  Pulse 74  Temp(Src) 99.3 F (37.4 C) (Oral)  Resp 16  Ht 5' (1.524 m)  Wt 115 lb (52.164 kg)  BMI 22.46 kg/m2  SpO2 100%  Physical Exam  Nursing note and vitals reviewed. Constitutional: She is oriented to person, place, and time. She appears well-developed and well-nourished. No distress.  HENT:  Head: Normocephalic and atraumatic.  Eyes: Conjunctivae and EOM are normal. Pupils are equal, round, and reactive to light. Right eye exhibits no discharge. Left eye exhibits no discharge. No scleral icterus.         Redness to L lower lateral lid.  Tiny plaque noted on lid margin in same area but does  not look c/w stye formation but an early cellulitis.  Neck: Normal range of motion.  Cardiovascular: Normal rate, regular rhythm and normal heart sounds.   Pulmonary/Chest: Effort normal and breath sounds normal.  Abdominal: Soft. She exhibits no distension. There is no tenderness.  Musculoskeletal: Normal range of motion.       1.5 x 3 cm fluctuant lesion in R axilla.  + draining  Neurological: She is alert and oriented to person, place, and time.  Skin: Skin is warm and dry.  Psychiatric: She has a normal mood and affect. Judgment normal.    ED Course  Procedures (including critical care time)  Labs Reviewed - No data to display No results found.   No diagnosis found.    MDM          Worthy Rancher, PA 09/23/11 [phone]

## 2011-09-23 NOTE — Discharge Instructions (Signed)
Cellulitis Cellulitis is an infection of the tissue under the skin. The infected area is usually red and tender. This is caused by germs. These germs enter the body through cuts or sores. This usually happens in the arms or lower legs. HOME CARE   Take your medicine as told. Finish it even if you start to feel better.   If the infection is on the arm or leg, keep it raised (elevated).   Use a warm cloth on the infected area several times a day.   See your doctor for a follow-up visit as told.  GET HELP RIGHT AWAY IF:   You are tired or confused.   You throw up (vomit).   You have watery poop (diarrhea).   You feel ill and have muscle aches.   You have a fever.  MAKE SURE YOU:   Understand these instructions.   Will watch your condition.   Will get help right away if you are not doing well or get worse.  Document Released: 12/28/2007 Document Revised: 03/23/2011 Document Reviewed: 06/12/2009 [facility] Patient Information 2012 [place], [place].Abscess An abscess (boil or furuncle) is an infected area that contains a collection of pus.  SYMPTOMS Signs and symptoms of an abscess include pain, tenderness, redness, or hardness. You may feel a moveable soft area under your skin. An abscess can occur anywhere in the body.  TREATMENT  A surgical cut (incision) may be made over your abscess to drain the pus. Gauze may be packed into the space or a drain may be looped through the abscess cavity (pocket). This provides a drain that will allow the cavity to heal from the inside outwards. The abscess may be painful for a few days, but should feel much better if it was drained.  Your abscess, if seen early, may not have localized and may not have been drained. If not, another appointment may be required if it does not get better on its own or with medications. HOME CARE INSTRUCTIONS   Only take over-the-counter or prescription medicines for pain, discomfort, or fever as directed by your  caregiver.   Take your antibiotics as directed if they were prescribed. Finish them even if you start to feel better.   Keep the skin and clothes clean around your abscess.   If the abscess was drained, you will need to use gauze dressing to collect any draining pus. Dressings will typically need to be changed 3 or more times a day.   The infection may spread by skin contact with others. Avoid skin contact as much as possible.   Practice good hygiene. This includes regular hand washing, cover any draining skin lesions, and do not share personal care items.   If you participate in sports, do not share athletic equipment, towels, whirlpools, or personal care items. Shower after every practice or tournament.   If a draining area cannot be adequately covered:   Do not participate in sports.   Children should not participate in day care until the wound has healed or drainage stops.   If your caregiver has given you a follow-up appointment, it is very important to keep that appointment. Not keeping the appointment could result in a much worse infection, chronic or permanent injury, pain, and disability. If there is any problem keeping the appointment, you must call back to this facility for assistance.  SEEK MEDICAL CARE IF:   You develop increased pain, swelling, redness, drainage, or bleeding in the wound site.   You develop signs  of generalized infection including muscle aches, chills, fever, or a general ill feeling.   You have an oral temperature above 102 F (38.9 C).  MAKE SURE YOU:   Understand these instructions.   Will watch your condition.   Will get help right away if you are not doing well or get worse.  Document Released: 04/20/2005 Document Revised: 03/23/2011 Document Reviewed: 02/12/2008 [facility] Patient Information 2012 [place], [place].Heat Therapy Your caregiver advises heat therapy for your condition. Heat applications help reduce pain and muscle spasm around  injuries or areas of inflammation. They also increase blood flow to the area which can speed healing. Moist heat is commonly used to help heal skin infections. Heat treatments should be used for about 30-40 minutes every 2-4 hours. Shorter treatments should be used if there is discomfort. Different forms of heat therapy are:  Warm water - Use a basin or tub filled with heated water; change it often to keep the water hot. The water temperature should not be uncomfortable to the skin.   Hot packs - Use several bath towels soaked in hot water and lightly wrung out. These should be changed every 5-10 minutes. You can buy commercially-available packs that provide more sustained heat. Hot water bottles are not recommended because they give only a small amount of heat.   Electric heating pads - These may be used for dry heat only. Do not use wet material around a regular heating pad because of the risk of electrical shock. Do not leave heating pads on for long periods as they can burn the skin or cause permanent discoloration. Do not lie on top of a heating pad because, again, this can cause a burn.   Heat lamps - Use an infrared light. Keep the bulb 15-25 inches from the skin. Watch for signs of excessive heat (blotchy areas will appear).  Be cautious with heat therapy to avoid burning the skin. You should not use heat therapy without careful medical supervision if you have: circulation problems, numbness or unusual swelling in the area to be treated. Document Released: 07/11/2005 Document Revised: 03/23/2011 Document Reviewed: 01/06/2007 [facility] Patient Information 2012 [place], [place]   .take the antibiotic as directed.  Insert 2 drops of tobrex in left eye 4 times daily for 5-7 days.  Apply warm compresses to left eye and right axilla several times daily.  Return as needed.

## 2011-09-23 NOTE — ED Notes (Signed)
Pt presents to Ed with red swollen left lower eye and Rt axilla area. Pt states started 2 weeks ago, has placed warm compresses on axilla with some relief. Pt reports rt axilla area "popped and fluid/pus came out". Pt's partner states he was diagnosed with MRSA.

## 2011-09-24 NOTE — ED Provider Notes (Signed)
Medical screening examination/treatment/procedure(s) were performed by non-physician practitioner and as supervising physician I was immediately available for consultation/collaboration.   Diondra Pines R Kamilah Correia, MD 09/24/11 1633 

## 2012-04-07 ENCOUNTER — Emergency Department (HOSPITAL_COMMUNITY): Payer: Self-pay

## 2012-04-07 ENCOUNTER — Emergency Department (HOSPITAL_COMMUNITY)
Admission: EM | Admit: 2012-04-07 | Discharge: 2012-04-07 | Disposition: A | Discharge status: Home / Self Care | Payer: Self-pay | Attending: Emergency Medicine | Admitting: Emergency Medicine

## 2012-04-07 ENCOUNTER — Encounter (HOSPITAL_COMMUNITY): Payer: Self-pay | Admitting: Emergency Medicine

## 2012-04-07 DIAGNOSIS — F17200 Nicotine dependence, unspecified, uncomplicated: Secondary | ICD-10-CM | POA: Insufficient documentation

## 2012-04-07 DIAGNOSIS — J189 Pneumonia, unspecified organism: Secondary | ICD-10-CM | POA: Insufficient documentation

## 2012-04-07 DIAGNOSIS — Z9089 Acquired absence of other organs: Secondary | ICD-10-CM | POA: Insufficient documentation

## 2012-04-07 DIAGNOSIS — R05 Cough: Secondary | ICD-10-CM

## 2012-04-07 DIAGNOSIS — K219 Gastro-esophageal reflux disease without esophagitis: Secondary | ICD-10-CM | POA: Insufficient documentation

## 2012-04-07 MED ORDER — ALBUTEROL SULFATE (5 MG/ML) 0.5% IN NEBU
5.0000 mg | INHALATION_SOLUTION | Freq: Once | RESPIRATORY_TRACT | Status: AC
  Administered 2012-04-07: 5 mg via RESPIRATORY_TRACT
  Filled 2012-04-07: qty 1

## 2012-04-07 MED ORDER — IBUPROFEN 400 MG PO TABS
600.0000 mg | ORAL_TABLET | Freq: Once | ORAL | Status: AC
  Administered 2012-04-07: 600 mg via ORAL
  Filled 2012-04-07: qty 2

## 2012-04-07 MED ORDER — AZITHROMYCIN 250 MG PO TABS
500.0000 mg | ORAL_TABLET | Freq: Once | ORAL | Status: AC
  Administered 2012-04-07: 500 mg via ORAL
  Filled 2012-04-07: qty 2

## 2012-04-07 MED ORDER — AZITHROMYCIN 250 MG PO TABS: 250.0000 mg | ORAL_TABLET | Freq: Every day | ORAL | Status: AC

## 2012-04-07 MED ORDER — ALBUTEROL SULFATE HFA 108 (90 BASE) MCG/ACT IN AERS
2.0000 | INHALATION_SPRAY | RESPIRATORY_TRACT | Status: DC | PRN
  Administered 2012-04-07: 2 via RESPIRATORY_TRACT
  Filled 2012-04-07: qty 6.7

## 2012-04-07 MED ORDER — AMOXICILLIN 250 MG PO CAPS
1000.0000 mg | ORAL_CAPSULE | Freq: Once | ORAL | Status: AC
  Administered 2012-04-07: 1000 mg via ORAL
  Filled 2012-04-07: qty 4

## 2012-04-07 MED ORDER — ALBUTEROL SULFATE HFA 108 (90 BASE) MCG/ACT IN AERS: 2.0000 | INHALATION_SPRAY | RESPIRATORY_TRACT | Status: DC | PRN

## 2012-04-07 MED ORDER — AMOXICILLIN 500 MG PO CAPS: 1000.0000 mg | ORAL_CAPSULE | Freq: Three times a day (TID) | ORAL | Status: AC

## 2012-04-07 NOTE — ED Provider Notes (Signed)
History     CSN: [id number]  Arrival date & time 04/07/12  1911   First MD Initiated Contact with Patient 04/07/12 1951      Chief Complaint  Patient presents with  . Chest Pain  . Cough  . Fever    (Consider location/radiation/quality/duration/timing/severity/associated sxs/prior treatment) Patient is a 31 y.o. female presenting with cough and fever. The history is provided by the patient.  Cough Pertinent negatives include no chest pain, no chills, no headaches, no sore throat and no eye redness.  Fever Primary symptoms of the febrile illness include fever and cough. Primary symptoms do not include headaches, abdominal pain, vomiting, diarrhea, dysuria or rash.  pt c/o occasionally productive cough for past 2-3 week, w fever in past couple days. Denies sore throat or runny nose. ?hx bronchitis. No hx asthma. Smoker. Denies chest pain. No headache or body aches. Occasionally feels wheezy, denies sob. No known ill contacts.      Past Medical History  Diagnosis Date  . GERD (gastroesophageal reflux disease)     Past Surgical History  Procedure Date  . Abdominal hysterectomy   . Cholecystectomy   . Tubal ligation     History reviewed. No pertinent family history.  History  Substance Use Topics  . Smoking status: Current Every Day Smoker -- 0.5 packs/day    Types: Cigarettes  . Smokeless tobacco: Not on file  . Alcohol Use: No    OB History    Grav Para Term Preterm Abortions TAB SAB Ect Mult Living                  Review of Systems  Constitutional: Positive for fever. Negative for chills.  HENT: Negative for sore throat and neck pain.   Eyes: Negative for discharge and redness.  Respiratory: Positive for cough.   Cardiovascular: Negative for chest pain.  Gastrointestinal: Negative for vomiting, abdominal pain and diarrhea.  Genitourinary: Negative for dysuria and flank pain.  Musculoskeletal: Negative for back pain.  Skin: Negative for rash.    Neurological: Negative for headaches.  Hematological: Does not bruise/bleed easily.  Psychiatric/Behavioral: Negative for confusion.    Allergies  Review of patient's allergies indicates no known allergies.  Home Medications  No current outpatient prescriptions on file.  BP 110/65  Pulse 87  Temp 100 F (37.8 C) (Oral)  Resp 20  Ht 5' (1.524 m)  Wt 113 lb 2 oz (51.313 kg)  BMI 22.09 kg/m2  SpO2 97%  Physical Exam  Nursing note and vitals reviewed. Constitutional: She appears well-developed and well-nourished. No distress.  HENT:  Nose: Nose normal.  Mouth/Throat: Oropharynx is clear and moist.  Eyes: Conjunctivae normal are normal. No scleral icterus.  Neck: Neck supple. No tracheal deviation present.       No stiffness or rigidity  Cardiovascular: Normal rate, regular rhythm, normal heart sounds and intact distal pulses.  Exam reveals no friction rub.   No murmur heard. Pulmonary/Chest: Effort normal. She has wheezes.       Good air exchange. Mild wheezing.   Abdominal: Soft. Normal appearance and bowel sounds are normal. She exhibits no distension. There is no tenderness.  Genitourinary:       No cva tenderness  Musculoskeletal: She exhibits no edema and no tenderness.  Neurological: She is alert.  Skin: Skin is warm and dry. No rash noted.  Psychiatric: She has a normal mood and affect.    ED Course  Procedures (including critical care time)  Dg Chest 2  View  04/07/2012  *RADIOLOGY REPORT*  Clinical Data: Cough and fever  CHEST - 2 VIEW  Comparison: None.  Findings: There is mild scarring in the right base.  There is patchy infiltrate in the right middle lobe.  Elsewhere the lungs are clear.  The heart size and pulmonary vascularity are normal.  No adenopathy.  IMPRESSION: Patchy right middle lobe infiltrate.   Original Report Authenticated By: Arvin Collard. WOODRUFF III, M.D.        MDM  Albuterol neb tx. Cxr. Motrin po.   Reviewed nursing notes and prior  charts for additional history.   Recheck no increased wob. No wheezing. Good air exchange.   Pt w fever, prod cough, infiltrate on cxr.   Will rx hd amox +zithromax.         Suzi Roots, MD 04/07/12 2108

## 2012-04-07 NOTE — ED Notes (Signed)
Patient complaining of cough x 3 weeks. Fever and chest discomfort x 1 week.

## 2014-11-27 ENCOUNTER — Emergency Department (HOSPITAL_COMMUNITY)
Admission: EM | Admit: 2014-11-27 | Discharge: 2014-11-27 | Disposition: A | Discharge status: Home / Self Care | Payer: Self-pay | Attending: Emergency Medicine | Admitting: Emergency Medicine

## 2014-11-27 ENCOUNTER — Encounter (HOSPITAL_COMMUNITY): Payer: Self-pay | Admitting: Emergency Medicine

## 2014-11-27 ENCOUNTER — Emergency Department (HOSPITAL_COMMUNITY): Payer: Self-pay

## 2014-11-27 DIAGNOSIS — R011 Cardiac murmur, unspecified: Secondary | ICD-10-CM | POA: Insufficient documentation

## 2014-11-27 DIAGNOSIS — M1712 Unilateral primary osteoarthritis, left knee: Secondary | ICD-10-CM | POA: Insufficient documentation

## 2014-11-27 DIAGNOSIS — Z8719 Personal history of other diseases of the digestive system: Secondary | ICD-10-CM | POA: Insufficient documentation

## 2014-11-27 DIAGNOSIS — Z79899 Other long term (current) drug therapy: Secondary | ICD-10-CM | POA: Insufficient documentation

## 2014-11-27 DIAGNOSIS — Z720 Tobacco use: Secondary | ICD-10-CM | POA: Insufficient documentation

## 2014-11-27 HISTORY — DX: Cardiac murmur, unspecified: R01.1

## 2014-11-27 MED ORDER — KETOROLAC TROMETHAMINE 10 MG PO TABS
10.0000 mg | ORAL_TABLET | Freq: Once | ORAL | Status: AC
  Administered 2014-11-27: 10 mg via ORAL
  Filled 2014-11-27: qty 1

## 2014-11-27 MED ORDER — PREDNISONE 50 MG PO TABS
60.0000 mg | ORAL_TABLET | Freq: Once | ORAL | Status: AC
  Administered 2014-11-27: 60 mg via ORAL
  Filled 2014-11-27 (×2): qty 1

## 2014-11-27 MED ORDER — ONDANSETRON HCL 4 MG PO TABS
4.0000 mg | ORAL_TABLET | Freq: Once | ORAL | Status: AC
  Administered 2014-11-27: 4 mg via ORAL
  Filled 2014-11-27: qty 1

## 2014-11-27 MED ORDER — HYDROCODONE-ACETAMINOPHEN 5-325 MG PO TABS
2.0000 | ORAL_TABLET | Freq: Once | ORAL | Status: AC
  Administered 2014-11-27: 2 via ORAL
  Filled 2014-11-27: qty 2

## 2014-11-27 MED ORDER — DICLOFENAC SODIUM 75 MG PO TBEC: 75.0000 mg | DELAYED_RELEASE_TABLET | Freq: Two times a day (BID) | ORAL | Status: DC

## 2014-11-27 MED ORDER — HYDROCODONE-ACETAMINOPHEN 5-325 MG PO TABS: 1.0000 | ORAL_TABLET | ORAL | Status: DC | PRN

## 2014-11-27 NOTE — Discharge Instructions (Signed)
Please use the knee immobilizer when you're up and about. You do not have to sleep in this device. Use crutches until you can safely put weight on your left lower extremity. It is important that she see Dr. Romeo Apple, or the orthopedist of your choice as sone as possible concerning your knee. Please use diclofenac 2 times daily, may use Tylenol in between the doses if needed for mild pain, may use Norco for more severe pain. Osteoarthritis Osteoarthritis is a disease that causes soreness and inflammation of a joint. It occurs when the cartilage at the affected joint wears down. Cartilage acts as a cushion, covering the ends of bones where they meet to form a joint. Osteoarthritis is the most common form of arthritis. It often occurs in older people. The joints affected most often by this condition include those in the:  Ends of the fingers.  Thumbs.  Neck.  Lower back.  Knees.  Hips. CAUSES  Over time, the cartilage that covers the ends of bones begins to wear away. This causes bone to rub on bone, producing pain and stiffness in the affected joints.  RISK FACTORS Certain factors can increase your chances of having osteoarthritis, including:  Older age.  Excessive body weight.  Overuse of joints.  Previous joint injury. SIGNS AND SYMPTOMS   Pain, swelling, and stiffness in the joint.  Over time, the joint may lose its normal shape.  Small deposits of bone (osteophytes) may grow on the edges of the joint.  Bits of bone or cartilage can break off and float inside the joint space. This may cause more pain and damage. DIAGNOSIS  Your health care provider will do a physical exam and ask about your symptoms. Various tests may be ordered, such as:  X-rays of the affected joint.  An MRI scan.  Blood tests to rule out other types of arthritis.  Joint fluid tests. This involves using a needle to draw fluid from the joint and examining the fluid under a microscope. TREATMENT  Goals  of treatment are to control pain and improve joint function. Treatment plans may include:  A prescribed exercise program that allows for rest and joint relief.  A weight control plan.  Pain relief techniques, such as:  Properly applied heat and cold.  Electric pulses delivered to nerve endings under the skin (transcutaneous electrical nerve stimulation [TENS]).  Massage.  Certain nutritional supplements.  Medicines to control pain, such as:  Acetaminophen.  Nonsteroidal anti-inflammatory drugs (NSAIDs), such as naproxen.  Narcotic or central-acting agents, such as tramadol.  Corticosteroids. These can be given orally or as an injection.  Surgery to reposition the bones and relieve pain (osteotomy) or to remove loose pieces of bone and cartilage. Joint replacement may be needed in advanced states of osteoarthritis. HOME CARE INSTRUCTIONS   Take medicines only as directed by your health care provider.  Maintain a healthy weight. Follow your health care provider's instructions for weight control. This may include dietary instructions.  Exercise as directed. Your health care provider can recommend specific types of exercise. These may include:  Strengthening exercises. These are done to strengthen the muscles that support joints affected by arthritis. They can be performed with weights or with exercise bands to add resistance.  Aerobic activities. These are exercises, such as brisk walking or low-impact aerobics, that get your heart pumping.  Range-of-motion activities. These keep your joints limber.  Balance and agility exercises. These help you maintain daily living skills.  Rest your affected joints as  directed by your health care provider.  Keep all follow-up visits as directed by your health care provider. SEEK MEDICAL CARE IF:   Your skin turns red.  You develop a rash in addition to your joint pain.  You have worsening joint pain.  You have a fever along with  joint or muscle aches. SEEK IMMEDIATE MEDICAL CARE IF:  You have a significant loss of weight or appetite.  You have night sweats. FOR MORE INFORMATION   National Institute of Arthritis and Musculoskeletal and Skin Diseases: www.niams.http://www.myers.net/  [employer] on Aging: https://walker.com/  American College of Rheumatology: www.rheumatology.org Document Released: 07/11/2005 Document Revised: 11/25/2013 Document Reviewed: 03/18/2013 [facility] Patient Information 2015 [place], [place]. This information is not intended to replace advice given to you by your health care provider. Make sure you discuss any questions you have with your health care provider.

## 2014-11-27 NOTE — ED Notes (Signed)
Patient complaining of left knee pain x 2 months. Also complaining of lower back pain.

## 2014-11-27 NOTE — ED Provider Notes (Signed)
CSN: [id number]     Arrival date & time 11/27/14  1747 History   First MD Initiated Contact with Patient 11/27/14 1844     Chief Complaint  Patient presents with  . Knee Pain     (Consider location/radiation/quality/duration/timing/severity/associated sxs/prior Treatment) HPI Comments: Patient is a 34 year old female who presents to the emergency department with left knee pain. The patient states that this episode of pain is been going on for about 2 months. She states that she has been diagnosed in the past with cartilage problems involving the knee. She has not had any recent fall, there's been no recent injury. His been no recent operations involving the knee. Patient denies any fever or chills recently.  Patient is a 34 y.o. female presenting with knee pain. The history is provided by the patient.  Knee Pain Location:  Knee Time since incident:  2 months   Past Medical History  Diagnosis Date  . GERD (gastroesophageal reflux disease)   . Heart murmur of newborn    Past Surgical History  Procedure Laterality Date  . Abdominal hysterectomy    . Cholecystectomy    . Tubal ligation     History reviewed. No pertinent family history. History  Substance Use Topics  . Smoking status: Current Every Day Smoker -- 0.50 packs/day    Types: Cigarettes  . Smokeless tobacco: Not on file  . Alcohol Use: No   OB History    No data available     Review of Systems  Musculoskeletal: Positive for arthralgias.  All other systems reviewed and are negative.     Allergies  Review of patient's allergies indicates no known allergies.  Home Medications   Prior to Admission medications   Medication Sig Start Date End Date Taking? Authorizing Provider  acetaminophen (TYLENOL) 500 MG tablet Take 500-1,500 mg by mouth every 6 (six) hours as needed for mild pain or moderate pain.   Yes Historical Provider, MD  albuterol (PROVENTIL HFA;VENTOLIN HFA) 108 (90 BASE) MCG/ACT inhaler Inhale 2  puffs into the lungs every 4 (four) hours as needed for wheezing. Patient not taking: Reported on 11/27/2014 04/07/12 04/07/13  Cathren Laine, MD   BP 112/65 mmHg  Pulse 112  Temp(Src) 98.3 F (36.8 C) (Oral)  Resp 18  Ht 4\' 11"  (1.499 m)  Wt 115 lb (52.164 kg)  BMI 23.21 kg/m2  SpO2 99% Physical Exam  Constitutional: She is oriented to person, place, and time. She appears well-developed and well-nourished.  Non-toxic appearance.  HENT:  Head: Normocephalic.  Right Ear: Tympanic membrane and external ear normal.  Left Ear: Tympanic membrane and external ear normal.  Eyes: EOM and lids are normal. Pupils are equal, round, and reactive to light.  Neck: Normal range of motion. Neck supple. Carotid bruit is not present.  Cardiovascular: Normal rate, regular rhythm, normal heart sounds, intact distal pulses and normal pulses.   Pulmonary/Chest: Breath sounds normal. No respiratory distress.  Abdominal: Soft. Bowel sounds are normal. There is no tenderness. There is no guarding.  Musculoskeletal: Normal range of motion.  There is full range of motion of the left hip. There is increased warmth of the left knee. There is crepitus with attempted range of motion. There is pain with attempted flexion and extension anteriorly involving the left knee. The anterior tibial tuberosity is intact. There is no edema of the lower extremities. The dorsalis pedis pulses 2+ bilaterally.  Lymphadenopathy:       Head (right side): No submandibular adenopathy present.  Head (left side): No submandibular adenopathy present.    She has no cervical adenopathy.  Neurological: She is alert and oriented to person, place, and time. She has normal strength. No cranial nerve deficit or sensory deficit.  Skin: Skin is warm and dry.  Psychiatric: She has a normal mood and affect. Her speech is normal.  Nursing note and vitals reviewed.   ED Course  Procedures (including critical care time) Labs Review Labs Reviewed  - No data to display  Imaging Review Dg Knee Complete 4 Views Left  11/27/2014   CLINICAL DATA:  Anterior and posterior left knee pain for 2 months, worse this week. Initial encounter.  EXAM: LEFT KNEE - COMPLETE 4+ VIEW  COMPARISON:  None.  FINDINGS: There is no evidence of fracture, dislocation, or joint effusion. There is no evidence of arthropathy or other focal bone abnormality. Soft tissues are unremarkable.  IMPRESSION: Negative.   Electronically Signed   By: Marnee Spring M.D.   On: 11/27/2014 18:33     EKG Interpretation None      MDM  The vital signs are reviewed. The pulse oximetry is 99% on room air. Within normal limits by my interpretation. There is crepitus and question of a very mild effusion involving the left knee. The patient gives history that she's had cartilage issues in the past. The patient is treated with a knee immobilizer and crutches. She is referred to Dr. Romeo Apple for additional evaluation and management.    Final diagnoses:  None    *I have reviewed nursing notes, vital signs, and all appropriate lab and imaging results for this patient.[address], PA-C 11/27/14 2016  Margarita Grizzle, MD 11/27/14 [phone]

## 2015-02-02 ENCOUNTER — Encounter (HOSPITAL_COMMUNITY): Payer: Self-pay | Admitting: Emergency Medicine

## 2015-02-02 ENCOUNTER — Emergency Department (HOSPITAL_COMMUNITY): Payer: Self-pay

## 2015-02-02 ENCOUNTER — Emergency Department (HOSPITAL_COMMUNITY)
Admission: EM | Admit: 2015-02-02 | Discharge: 2015-02-02 | Disposition: A | Discharge status: Home / Self Care | Payer: Self-pay | Attending: Emergency Medicine | Admitting: Emergency Medicine

## 2015-02-02 DIAGNOSIS — R600 Localized edema: Secondary | ICD-10-CM | POA: Insufficient documentation

## 2015-02-02 DIAGNOSIS — I83893 Varicose veins of bilateral lower extremities with other complications: Secondary | ICD-10-CM

## 2015-02-02 DIAGNOSIS — G8929 Other chronic pain: Secondary | ICD-10-CM | POA: Insufficient documentation

## 2015-02-02 DIAGNOSIS — Z791 Long term (current) use of non-steroidal anti-inflammatories (NSAID): Secondary | ICD-10-CM | POA: Insufficient documentation

## 2015-02-02 DIAGNOSIS — Z720 Tobacco use: Secondary | ICD-10-CM | POA: Insufficient documentation

## 2015-02-02 DIAGNOSIS — R011 Cardiac murmur, unspecified: Secondary | ICD-10-CM | POA: Insufficient documentation

## 2015-02-02 DIAGNOSIS — Z8719 Personal history of other diseases of the digestive system: Secondary | ICD-10-CM | POA: Insufficient documentation

## 2015-02-02 DIAGNOSIS — R609 Edema, unspecified: Secondary | ICD-10-CM

## 2015-02-02 HISTORY — DX: Pain in unspecified knee: M25.569

## 2015-02-02 HISTORY — DX: Other chronic pain: G89.29

## 2015-02-02 LAB — CBC
HCT: 38.6 % (ref 36.0–46.0)
HEMOGLOBIN: 12.7 g/dL (ref 12.0–15.0)
MCH: 31.4 pg (ref 26.0–34.0)
MCHC: 32.9 g/dL (ref 30.0–36.0)
MCV: 95.5 fL (ref 78.0–100.0)
Platelets: 249 10*3/uL (ref 150–400)
RBC: 4.04 MIL/uL (ref 3.87–5.11)
RDW: 12.6 % (ref 11.5–15.5)
WBC: 5.6 10*3/uL (ref 4.0–10.5)

## 2015-02-02 LAB — BASIC METABOLIC PANEL
Anion gap: 7 (ref 5–15)
BUN: 6 mg/dL (ref 6–20)
CHLORIDE: 102 mmol/L (ref 101–111)
CO2: 31 mmol/L (ref 22–32)
Calcium: 8.7 mg/dL — ABNORMAL LOW (ref 8.9–10.3)
Creatinine, Ser: 0.84 mg/dL (ref 0.44–1.00)
GFR calc non Af Amer: >60 mL/min (ref >60)
GLUCOSE: 89 mg/dL (ref 65–99)
POTASSIUM: 3.2 mmol/L — AB (ref 3.5–5.1)
SODIUM: 140 mmol/L (ref 135–145)

## 2015-02-02 LAB — TROPONIN I: Troponin I: <0.03 ng/mL (ref <0.031)

## 2015-02-02 LAB — BRAIN NATRIURETIC PEPTIDE: B NATRIURETIC PEPTIDE 5: 14 pg/mL (ref 0.0–100.0)

## 2015-02-02 MED ORDER — POTASSIUM CHLORIDE CRYS ER 20 MEQ PO TBCR
40.0000 meq | EXTENDED_RELEASE_TABLET | Freq: Once | ORAL | Status: AC
  Administered 2015-02-02: 40 meq via ORAL
  Filled 2015-02-02: qty 2

## 2015-02-02 NOTE — ED Provider Notes (Signed)
CSN: [id number]     Arrival date & time 02/02/15  1527 History   First MD Initiated Contact with Patient 02/02/15 1948     Chief Complaint  Patient presents with  . Leg Swelling     HPI Pt was seen at 2030. Per pt, c/o gradual onset and persistence of constant bilat feet and ankles "swelling" for the past 4 days. Pt states she has elevated her legs without change. Denies CP/SOB, no abd pain, no N/V/D, no back pain, no dysuria/hematuria, no fevers, no rash, no focal motor weakness, no tingling/numbness in extremities.     Past Medical History  Diagnosis Date  . GERD (gastroesophageal reflux disease)   . Heart murmur of newborn   . Chronic knee pain    Past Surgical History  Procedure Laterality Date  . Cholecystectomy    . Tubal ligation    . Foot surgery    . Abdominal hysterectomy      total    History  Substance Use Topics  . Smoking status: Current Every Day Smoker -- 0.50 packs/day    Types: Cigarettes  . Smokeless tobacco: Not on file  . Alcohol Use: No    Review of Systems ROS: Statement: All systems negative except as marked or noted in the HPI; Constitutional: Negative for fever and chills. ; ; Eyes: Negative for eye pain, redness and discharge. ; ; ENMT: Negative for ear pain, hoarseness, nasal congestion, sinus pressure and sore throat. ; ; Cardiovascular: Negative for chest pain, palpitations, diaphoresis, dyspnea and +peripheral edema. ; ; Respiratory: Negative for cough, wheezing and stridor. ; ; Gastrointestinal: Negative for nausea, vomiting, diarrhea, abdominal pain, blood in stool, hematemesis, jaundice and rectal bleeding. . ; ; Genitourinary: Negative for dysuria, flank pain and hematuria. ; ; Musculoskeletal: Negative for back pain and neck pain. Negative for swelling and trauma.; ; Skin: Negative for pruritus, rash, abrasions, blisters, bruising and skin lesion.; ; Neuro: Negative for headache, lightheadedness and neck stiffness. Negative for weakness, altered  level of consciousness , altered mental status, extremity weakness, paresthesias, involuntary movement, seizure and syncope.      Allergies  Review of patient's allergies indicates no known allergies.  Home Medications   Prior to Admission medications   Medication Sig Start Date End Date Taking? Authorizing Provider  acetaminophen (TYLENOL) 500 MG tablet Take 500-1,500 mg by mouth every 6 (six) hours as needed for mild pain or moderate pain.   Yes Historical Provider, MD  albuterol (PROVENTIL HFA;VENTOLIN HFA) 108 (90 BASE) MCG/ACT inhaler Inhale 2 puffs into the lungs every 4 (four) hours as needed for wheezing. Patient not taking: Reported on 11/27/2014 04/07/12 04/07/13  Cathren Laine, MD  diclofenac (VOLTAREN) 75 MG EC tablet Take 1 tablet (75 mg total) by mouth 2 (two) times daily. Patient not taking: Reported on 02/02/2015 11/27/14   Ivery Quale, PA-C  HYDROcodone-acetaminophen (NORCO/VICODIN) 5-325 MG per tablet Take 1 tablet by mouth every 4 (four) hours as needed. Patient not taking: Reported on 02/02/2015 11/27/14   Ivery Quale, PA-C   BP 126/69 mmHg  Pulse 77  Temp(Src) 98.4 F (36.9 C) (Oral)  Resp 21  Ht 4\' 11"  (1.499 m)  Wt 126 lb 12.8 oz (57.516 kg)  BMI 25.60 kg/m2  SpO2 100% Physical Exam  2035: Physical examination:  Nursing notes reviewed; Vital signs and O2 SAT reviewed;  Constitutional: Well developed, Well nourished, Well hydrated, In no acute distress; Head:  Normocephalic, atraumatic; Eyes: EOMI, PERRL, No scleral icterus; ENMT: Mouth and  pharynx normal, Mucous membranes moist; Neck: Supple, Full range of motion, No lymphadenopathy; Cardiovascular: Regular rate and rhythm, No murmur, rub, or gallop; Respiratory: Breath sounds clear & equal bilaterally, No rales, rhonchi, wheezes.  Speaking full sentences with ease, Normal respiratory effort/excursion; Chest: Nontender, Movement normal; Abdomen: Soft, Nontender, Nondistended, Normal bowel sounds; Genitourinary: No CVA  tenderness; Extremities: Pulses normal, No deformity. No rash. No tenderness, +1 bilat feet and ankle edema, No calf edema or asymmetry.; Neuro: AA&Ox3, Major CN grossly intact.  Speech clear. No gross focal motor or sensory deficits in extremities. Climbs on and off stretcher easily by herself. Gait steady.; Skin: Color normal, Warm, Dry.   ED Course  Procedures      EKG Interpretation   Date/Time:  Monday February 02 2015 20:50:59 EDT Ventricular Rate:  98 PR Interval:  147 QRS Duration: 114 QT Interval:  362 QTC Calculation: 462 R Axis:   83 Text Interpretation:  Sinus rhythm Borderline intraventricular conduction  delay Low voltage, precordial leads Borderline repolarization abnormality  Baseline wander No old tracing to compare Confirmed by [facility]  MD,  Nicholos Johns [phone]) on 02/02/2015 9:00:00 PM      MDM  MDM Reviewed: previous chart, nursing note and vitals Reviewed previous: labs Interpretation: labs, ECG and x-ray     Results for orders placed or performed during the hospital encounter of 02/02/15  Basic metabolic panel  Result Value Ref Range   Sodium 140 135 - 145 mmol/L   Potassium 3.2 (L) 3.5 - 5.1 mmol/L   Chloride 102 101 - 111 mmol/L   CO2 31 22 - 32 mmol/L   Glucose, Bld 89 65 - 99 mg/dL   BUN 6 6 - 20 mg/dL   Creatinine, Ser 4.16 0.44 - 1.00 mg/dL   Calcium 8.7 (L) 8.9 - 10.3 mg/dL   GFR calc non Af Amer >60 >60 mL/min   GFR calc Af Amer >60 >60 mL/min   Anion gap 7 5 - 15  CBC  Result Value Ref Range   WBC 5.6 4.0 - 10.5 K/uL   RBC 4.04 3.87 - 5.11 MIL/uL   Hemoglobin 12.7 12.0 - 15.0 g/dL   HCT 38.4 53.6 - 46.8 %   MCV 95.5 78.0 - 100.0 fL   MCH 31.4 26.0 - 34.0 pg   MCHC 32.9 30.0 - 36.0 g/dL   RDW 03.2 12.2 - 48.2 %   Platelets 249 150 - 400 K/uL  Troponin I  Result Value Ref Range   Troponin I <0.03 <0.031 ng/mL  Brain natriuretic peptide  Result Value Ref Range   B Natriuretic Peptide 14.0 0.0 - 100.0 pg/mL   Dg Chest 2  View 02/02/2015   CLINICAL DATA:  Swelling. Shortness of breath. Lower extremity swelling for 4 days. Initial encounter.  EXAM: CHEST  2 VIEW  COMPARISON:  04/07/2012.  FINDINGS: Cardiopericardial silhouette within normal limits. No airspace disease or pleural effusion. Chronic bronchitic changes are present at the RIGHT lung base. Chronic blunting of the RIGHT costophrenic angle that appears unchanged compared to 04/07/2012. Chronic blunting of the LEFT costophrenic angle is also present, more evident on the lateral view than on the frontal. There is no pneumothorax. Cholecystectomy clips are present in the right upper quadrant.  IMPRESSION: No acute cardiopulmonary disease. Chronic bronchitic changes. Blunting of both costophrenic angles is a chronic finding and likely associated with scarring.   Electronically Signed   By: Andreas Newport M.D.   On: 02/02/2015 16:09    2115:  Workup  reassuring. Potassium repleted PO. VS remain stable, resps easy, abd soft/NT. Pt concerned regarding "a blood clot;" doubt same as cause for bilat feet/ankle edema, but will Vasc US bilat LE's for pt reassurance. Pt satisfied. Pt states she is ready to go home now. Dx and testing d/w pt.  Questions answered.  Verb understanding, agreeable to d/c home with outpt f/u.    Samuel Jester, DO 02/05/15 1206

## 2015-02-02 NOTE — Discharge Instructions (Signed)
°Emergency Department Resource Guide °1) Find a Doctor and Pay Out of Pocket °Although you won't have to find out who is covered by your insurance plan, it is a good idea to ask around and get recommendations. You will then need to call the office and see if the doctor you have chosen will accept you as a new patient and what types of options they offer for patients who are self-pay. Some doctors offer discounts or will set up payment plans for their patients who do not have insurance, but you will need to ask so you aren't surprised when you get to your appointment. ° °2) Contact Your Local Health Department °Not all health departments have doctors that can see patients for sick visits, but many do, so it is worth a call to see if yours does. If you don't know where your local health department is, you can check in your phone book. The CDC also has a tool to help you locate your state's health department, and many state websites also have listings of all of their local health departments. ° °3) Find a Walk-in Clinic °If your illness is not likely to be very severe or complicated, you may want to try a walk in clinic. These are popping up all over the country in pharmacies, drugstores, and shopping centers. They're usually staffed by nurse practitioners or physician assistants that have been trained to treat common illnesses and complaints. They're usually fairly quick and inexpensive. However, if you have serious medical issues or chronic medical problems, these are probably not your best option. ° °No Primary Care Doctor: °- Call [facility] at  832-8000 - they can help you locate a primary care doctor that  accepts your insurance, provides certain services, etc. °- Physician Referral Service- 1-800-533-3463 ° °Chronic Pain Problems: °Organization         Address  Phone   Notes  °[facility]  (336) 297-2271 Patients need to be referred by their primary care doctor.  ° °Medication  Assistance: °Organization         Address  Phone   Notes  °[place] [facility] [address] °[place], [place] [address] (336) 641-8030 --Must be a resident of [place] °-- Must have NO insurance coverage whatsoever (no Medicaid/ Medicare, etc.) °-- The pt. MUST have a primary care doctor that directs their care regularly and follows them in the community °  °[facility]  (866) 331-1348   °[employer]  (888) 892-1162   ° °Agencies that provide inexpensive medical care: °Organization         Address  Phone   Notes  °[facility]  (336) 832-8035   °[facility]    (336) 832-7272   °[facility] [address] °[place], [place] [address] (336) 832-4777   °[facility] [address], °[place] (336) 271-4999   °[facility]    (336) 373-0678   °[facility]    (336) 272-1050   °[facility] ° [address], [place] Phone:  (336) 832-4444, Fax:  (336) 832-4440 Hours of Operation:  9 am - 6 pm, M-F.  Also accepts Medicaid/Medicare and self-pay.  °[facility] ° [address], [place] Phone: (336) 832-3150, Fax: (336) 832-3151. Hours of Operation:  8:30 am - 5:30 pm, M-F.  Also accepts Medicaid and self-pay.  °[facility] [place] [address], [place] Phone: (336) 878-6027   °[facility] [address], [place], [place] (336)723-1848, Ext. 123 Mondays & Thursdays: 7-9 AM.  First 15 patients are seen on a first come, first serve basis. °  ° °Medicaid-accepting [place] Providers: ° °Organization         Address  Phone   Notes  °[facility] [address], [place] (336) 641-2100 Also accepts self-pay patients.  °[facility] [address], [place] ° (336) 856-9996   °[facility] [address], [place]  (336) 288-8857   °[facility] [address] [place] [address], [place] (336) 299-7000   °Veita Bland [address], [place]  ° (336) 373-1557 Only accepts [place] Access Medicaid patients after they have their name applied to their card.  ° °Self-Pay (no insurance) in [place]: ° °Organization         Address  Phone   Notes  °Sickle Cell Patients, [facility] [address], [place] (336) 832-1970   °[facility] [address], [place] (336) 832-4400   °[facility] ° [address], [place] (336) 992-4800   °[facility]/Dr. Osei-Bonsu ° [address] [place] [address], [place] or [address], [place] (336) 841-8500 Phone number for both [place] and [place] locations is the same.  °[facility] [address], [place] (336) 299-0000   °[facility] [address] [place] [address], [place] or [address] (336) 852-7530 °(336) 878-2260   °[facility] [address], [place] (336) 350-1642, phone; (336) 294-5005, fax Sees patients 1st and 3rd Saturday of every month.  Must not qualify for public or private insurance (i.e. Medicaid, Medicare, [place] Health Choice, Veterans' Benefits) • Household income should be no more than 200% of the poverty level •The clinic cannot treat you if you are pregnant or think you are pregnant • Sexually transmitted diseases are not treated at the clinic.  ° ° °Dental Care: °Organization         Address  Phone  Notes  °[place] [facility] [address], [place] (336) 641-6152 Accepts children up to age 21 who are enrolled in Medicaid or [place] Health Choice; pregnant women with a Medicaid card; and children who have applied for Medicaid or [place] Health Choice, but were declined, whose parents can pay a reduced fee at time of service.  °[place]  [facility] [place]  [address], [place] (336) 641-7733 Accepts children up to age 21 who are enrolled in Medicaid or [place] Health Choice; pregnant women with a Medicaid card; and children who have applied for Medicaid or [place] Health Choice, but were declined, whose parents can pay a reduced fee at time of service.  °[facility] ° [address], [place] (336) 641-4533 Patients are seen by appointment only. Walk-ins are not accepted. [facility] will see patients 18 years of age and older. °Monday - Tuesday (8am-5pm) °Most Wednesdays (8:30-5pm) °$30 per visit, cash only  °[facility] ° [address], [place] (336) 641-4533 Patients are seen by appointment only. Walk-ins are not accepted. [facility] will see patients 18 years of age and older. °One   Wednesday Evening (Monthly: Volunteer Based).  $30 per visit, cash only  °[facility]  (919) 537-3737 for adults; Children under age 4, call Graduate Pediatric Dentistry at (919) [phone]. Children aged 4-14, please call (919) [phone] to request a pediatric application. ° Dental services are provided in all areas of dental care including fillings, crowns and bridges, complete and partial dentures, implants, gum treatment, root canals, and extractions. Preventive care is also provided. Treatment is provided to both adults and children. °Patients are selected via a lottery and there is often a waiting list. °  °[facility] [address], °[place] ° (336) 763-8833 [email or web address] °  °[facility] [address], [place], [place] (336)723-1848, Ext. 123 Second and Fourth Thursday of each month, opens at 6:30 AM; Clinic ends at 9 AM.  Patients are seen on a first-come first-served basis, and a limited number are seen during each clinic.  ° °[facility] ° [address], [place], [place] (336) 723-7904    Eligibility Requirements °You must have lived in [place], or [place] counties for at least the last three months. °  You cannot be eligible for state or federal sponsored healthcare insurance, including Veterans Administration, Medicaid, or Medicare. °  You generally cannot be eligible for healthcare insurance through your employer.  °  How to apply: °Eligibility screenings are held every Tuesday and Wednesday afternoon from 1:00 pm until 4:00 pm. You do not need an appointment for the interview!  °[facility] [address], [place], [place] 336-631-2330   °[facility]  336-342-8273   °[facility]  336-703-3100   °[facility]  336-570-6415   ° °Behavioral Health Resources in the Community: °Intensive Outpatient Programs °Organization         Address  Phone  Notes  °[facility] [address], [place], [place] 336-878-6098   °[facility] [address], [place], [place] 336-832-9800   °[facility] [address], [place], [place] ° 336-882-2125   °[facility] [address],  °[place], [place] 1-800-853-5163 or 336-641-4981   °Substance Abuse Resources °Organization         Address  Phone  Notes  °[facility]  336-882-2125   °[facility]  336-784-9470   °[facility]  336-285-9073   °[facility]  336-845-3988   °Residential & Outpatient Substance Abuse Program  1-800-659-3381   °Psychological Services °Organization         Address  Phone  Notes  °[facility]  336- 832-9600   °[facility]  336- 378-7881   °[facility] [address], [place] 1-800-853-5163 or 336-641-4981   ° °Mobile Crisis Teams °Organization         Address  Phone  Notes  °[facility], Mobile Crisis Care Unit  1-877-626-1772   °Assertive °[facility] ° [address]  [place], [place] 336-834-9664   °Sharon DeEsch [address] °[place] 336-554-5454   ° °Self-Help/Support Groups °Organization         Address  Phone             Notes  °[facility]  - variety of support groups  336- 373-1402 Call for more information  °Narcotics Anonymous (NA), [facility] [address], °[place]  2 meetings at this location  ° °  Residential Treatment Programs Organization         Address  Phone  Notes  [facility] [address],    [place]  3-419-622-2979   [facility]  [address], [place] [address], [place], [place] 417-408-1448   [facility] [address] [place], [place] 185-631-4970 Admissions: 8am-3pm M-F  [facility] [address]    [place], [place] 263-785-8850   [facility] [address] [place], [place], [place] 277-412-8786   [facility] [address],  [place], [place] 767-209-4709   [facility] Intensive Outpatient [address], [place], [place] 628-366-2947   [facility] ([facility] [address] [place]  [place], [place] 6-546-503-5465 or 531-652-2810   [facility] ([facility] [address] [place], [place] 174-944-9675 Accepts Medicaid  [facility] [address],  [place] 9-163-846-6599 Substance Abuse/Addiction Treatment   [facility]         Address  Phone  Notes  [facility]  985-656-3605   Angie Fava, PhD [address] [place], [place]   401 502 7452 or 684-463-5363   [facility]   [address] [place], [place] [phone]   [facility] [address], [place], [place] 507-209-7309 Insurance/Medicaid/sponsorship through [facility] [address]                                    [place], [place] 907-212-0132 Therapy/tele-psych/case    [facility] [address] [place] [address], [place] 223-552-2716    Dr. Lolly Mustache  913-597-9200   [facility]  [employer] [facility] Dept. 1) [address], [place] 2) [address] [place] 3)  [address], [place] 607-668-9830 815-534-4540  365-884-1172   [facility] Child Abuse Hotline 930-095-9935 or 707-664-9285 (After Hours)      Take your usual prescriptions as previously directed. Elevate your legs as much as possible. Decrease the salt (sodium) in your diet.  Call your regular medical doctor tomorrow to schedule a follow up appointment within the week.  Return to the Emergency Department immediately sooner if worsening.

## 2015-02-02 NOTE — ED Notes (Signed)
Pt states that she has been swelling in her lower extremities for the past 4 days.

## 2015-02-03 ENCOUNTER — Ambulatory Visit (HOSPITAL_COMMUNITY)
Admit: 2015-02-03 | Discharge: 2015-02-03 | Disposition: A | Discharge status: Home / Self Care | Payer: Self-pay | Source: Ambulatory Visit | Attending: Emergency Medicine | Admitting: Emergency Medicine

## 2015-02-03 ENCOUNTER — Inpatient Hospital Stay (HOSPITAL_COMMUNITY): Admit: 2015-02-03 | Payer: Self-pay

## 2015-02-03 DIAGNOSIS — I83893 Varicose veins of bilateral lower extremities with other complications: Secondary | ICD-10-CM | POA: Insufficient documentation

## 2015-03-30 ENCOUNTER — Encounter (HOSPITAL_COMMUNITY): Payer: Self-pay | Admitting: Emergency Medicine

## 2015-03-30 DIAGNOSIS — Z8719 Personal history of other diseases of the digestive system: Secondary | ICD-10-CM | POA: Insufficient documentation

## 2015-03-30 DIAGNOSIS — L0100 Impetigo, unspecified: Secondary | ICD-10-CM | POA: Insufficient documentation

## 2015-03-30 DIAGNOSIS — R011 Cardiac murmur, unspecified: Secondary | ICD-10-CM | POA: Insufficient documentation

## 2015-03-30 DIAGNOSIS — Z79899 Other long term (current) drug therapy: Secondary | ICD-10-CM | POA: Insufficient documentation

## 2015-03-30 DIAGNOSIS — R599 Enlarged lymph nodes, unspecified: Secondary | ICD-10-CM | POA: Insufficient documentation

## 2015-03-30 DIAGNOSIS — G8929 Other chronic pain: Secondary | ICD-10-CM | POA: Insufficient documentation

## 2015-03-30 DIAGNOSIS — Z720 Tobacco use: Secondary | ICD-10-CM | POA: Insufficient documentation

## 2015-03-30 NOTE — ED Notes (Signed)
Pt c/o knot to the rt lateral neck and left wrist.

## 2015-03-31 ENCOUNTER — Emergency Department (HOSPITAL_COMMUNITY)
Admission: EM | Admit: 2015-03-31 | Discharge: 2015-03-31 | Disposition: A | Discharge status: Home / Self Care | Payer: Self-pay | Attending: Emergency Medicine | Admitting: Emergency Medicine

## 2015-03-31 DIAGNOSIS — R599 Enlarged lymph nodes, unspecified: Secondary | ICD-10-CM

## 2015-03-31 DIAGNOSIS — L0100 Impetigo, unspecified: Secondary | ICD-10-CM

## 2015-03-31 MED ORDER — SULFAMETHOXAZOLE-TRIMETHOPRIM 800-160 MG PO TABS: 1.0000 | ORAL_TABLET | Freq: Two times a day (BID) | ORAL | Status: DC

## 2015-03-31 MED ORDER — ACETAMINOPHEN 325 MG PO TABS
650.0000 mg | ORAL_TABLET | Freq: Once | ORAL | Status: AC
  Administered 2015-03-31: 650 mg via ORAL
  Filled 2015-03-31: qty 2

## 2015-03-31 MED ORDER — SULFAMETHOXAZOLE-TRIMETHOPRIM 800-160 MG PO TABS
1.0000 | ORAL_TABLET | Freq: Once | ORAL | Status: AC
  Administered 2015-03-31: 1 via ORAL
  Filled 2015-03-31: qty 1

## 2015-03-31 MED ORDER — IBUPROFEN 400 MG PO TABS
600.0000 mg | ORAL_TABLET | Freq: Once | ORAL | Status: AC
  Administered 2015-03-31: 600 mg via ORAL
  Filled 2015-03-31: qty 2

## 2015-03-31 NOTE — ED Provider Notes (Signed)
CSN: [id number]     Arrival date & time 03/30/15  2329 History   First MD Initiated Contact with Patient 03/31/15 0147     Chief Complaint  Patient presents with  . Abscess     (Consider location/radiation/quality/duration/timing/severity/associated sxs/prior Treatment) HPI patient reports on September she started having some pruritic areas on her skin that were small red bumps that have gotten larger and seemed to be spreading. She has some in her scalp and on her arms. She does not have them on her legs or trunk. She states they were initially very pruritic and she's been scratching at them and now they are just hurting. She denies any exposure to any type insects or spiders. No one else in the house has the similar rash.   PCP none  Past Medical History  Diagnosis Date  . GERD (gastroesophageal reflux disease)   . Heart murmur of newborn   . Chronic knee pain    Past Surgical History  Procedure Laterality Date  . Cholecystectomy    . Tubal ligation    . Foot surgery    . Abdominal hysterectomy      total   History reviewed. No pertinent family history. Social History  Substance Use Topics  . Smoking status: Current Every Day Smoker -- 0.50 packs/day    Types: Cigarettes  . Smokeless tobacco: None  . Alcohol Use: No     OB History    No data available     Review of Systems  All other systems reviewed and are negative.     Allergies  Review of patient's allergies indicates no known allergies.  Home Medications   Prior to Admission medications   Medication Sig Start Date End Date Taking? Authorizing Provider  acetaminophen (TYLENOL) 500 MG tablet Take 500-1,500 mg by mouth every 6 (six) hours as needed for mild pain or moderate pain.    Historical Provider, MD  albuterol (PROVENTIL HFA;VENTOLIN HFA) 108 (90 BASE) MCG/ACT inhaler Inhale 2 puffs into the lungs every 4 (four) hours as needed for wheezing. Patient not taking: Reported on 11/27/2014 04/07/12 04/07/13   Cathren Laine, MD  diclofenac (VOLTAREN) 75 MG EC tablet Take 1 tablet (75 mg total) by mouth 2 (two) times daily. Patient not taking: Reported on 02/02/2015 11/27/14   Ivery Quale, PA-C  HYDROcodone-acetaminophen (NORCO/VICODIN) 5-325 MG per tablet Take 1 tablet by mouth every 4 (four) hours as needed. Patient not taking: Reported on 02/02/2015 11/27/14   Ivery Quale, PA-C  sulfamethoxazole-trimethoprim (BACTRIM DS,SEPTRA DS) 800-160 MG per tablet Take 1 tablet by mouth 2 (two) times daily. 03/31/15   Devoria Albe, MD   BP 131/84 mmHg  Pulse 85  Temp(Src) 98.8 F (37.1 C)  Resp 18  Ht 4\' 11"  (1.499 m)  Wt 130 lb (58.968 kg)  BMI 26.24 kg/m2  SpO2 99%  Vital signs normal   Physical Exam  Constitutional: She is oriented to person, place, and time. She appears well-developed and well-nourished.  Non-toxic appearance. She does not appear ill. No distress.  HENT:  Head: Normocephalic and atraumatic.  Right Ear: External ear normal.  Left Ear: External ear normal.  Nose: Nose normal. No mucosal edema or rhinorrhea.  Mouth/Throat: Oropharynx is clear and moist and mucous membranes are normal. No dental abscesses or uvula swelling.  Eyes: Conjunctivae and EOM are normal. Pupils are equal, round, and reactive to light.  Neck: Normal range of motion and full passive range of motion without pain. Neck supple.  PT has  one swollen lymph node on the right just inferior to the skin lesion in her scalp.   Pulmonary/Chest: Effort normal. No respiratory distress. She has no rhonchi. She exhibits no crepitus.  Abdominal: Normal appearance.  Musculoskeletal: Normal range of motion.  Moves all extremities well.   Neurological: She is alert and oriented to person, place, and time. She has normal strength. No cranial nerve deficit.  Skin: Skin is warm, dry and intact. Rash noted. No erythema. No pallor.  Patient's noted to have a small red area on the dorsum of the radial aspect of her left wrist, please see  photo. It is noted to have a black center. Patient has some reddened areas in her scalp line 1 has a hyperkeratotic ring around it. She has some other excoriated areas with some redness on her upper back and on her right forearm. When the lesions are palpated they are all very superficial there are no abscesses being formed yet  Psychiatric: She has a normal mood and affect. Her speech is normal and behavior is normal. Her mood appears not anxious.  Nursing note and vitals reviewed.        ED Course  Procedures (including critical care time)  Medications  sulfamethoxazole-trimethoprim (BACTRIM DS,SEPTRA DS) 800-160 MG per tablet 1 tablet (not administered)  acetaminophen (TYLENOL) tablet 650 mg (not administered)  ibuprofen (ADVIL,MOTRIN) tablet 600 mg (not administered)    I discussed with patient this is most likely MRSA and to use Dial soap and take antibiotics as prescribed. We discussed the swollen lymph node in the right neck and she was advised thatcould take several months to go away.     MDM   Final diagnoses:  Impetigo  Lymph nodes enlarged    New Prescriptions   SULFAMETHOXAZOLE-TRIMETHOPRIM (BACTRIM DS,SEPTRA DS) 800-160 MG PER TABLET    Take 1 tablet by mouth 2 (two) times daily.    Plan discharge  Devoria Albe, MD, Concha Pyo, MD 03/31/15 [phone]

## 2015-03-31 NOTE — Discharge Instructions (Signed)
Use heat on the swollen lymph node for comfort. It will take several months for that lymph node to go away. Take the antibiotics until gone. Use bar Dial soap to help prevent spread of the infections. You can take zyrtec 10 mg OTC once a day for itching with pepcid 20 mg OTC twice a day for itching. Take ibuprofen 600 mg + acetaminophen 650 mg every 6 hrs for pain as needed. Recheck if you get a fever, the areas get more swollen or drain pus or seem worse.

## 2015-04-28 ENCOUNTER — Encounter (HOSPITAL_COMMUNITY): Payer: Self-pay | Admitting: *Deleted

## 2015-04-28 ENCOUNTER — Emergency Department (HOSPITAL_COMMUNITY): Payer: Self-pay

## 2015-04-28 ENCOUNTER — Emergency Department (HOSPITAL_COMMUNITY)
Admission: EM | Admit: 2015-04-28 | Discharge: 2015-04-28 | Disposition: A | Discharge status: Home / Self Care | Payer: Self-pay | Attending: Emergency Medicine | Admitting: Emergency Medicine

## 2015-04-28 DIAGNOSIS — M545 Low back pain, unspecified: Secondary | ICD-10-CM

## 2015-04-28 DIAGNOSIS — S8992XA Unspecified injury of left lower leg, initial encounter: Secondary | ICD-10-CM | POA: Insufficient documentation

## 2015-04-28 DIAGNOSIS — W19XXXA Unspecified fall, initial encounter: Secondary | ICD-10-CM

## 2015-04-28 DIAGNOSIS — Z792 Long term (current) use of antibiotics: Secondary | ICD-10-CM | POA: Insufficient documentation

## 2015-04-28 DIAGNOSIS — R011 Cardiac murmur, unspecified: Secondary | ICD-10-CM | POA: Insufficient documentation

## 2015-04-28 DIAGNOSIS — S3992XA Unspecified injury of lower back, initial encounter: Secondary | ICD-10-CM | POA: Insufficient documentation

## 2015-04-28 DIAGNOSIS — Y998 Other external cause status: Secondary | ICD-10-CM | POA: Insufficient documentation

## 2015-04-28 DIAGNOSIS — R260 Ataxic gait: Secondary | ICD-10-CM | POA: Insufficient documentation

## 2015-04-28 DIAGNOSIS — S8991XA Unspecified injury of right lower leg, initial encounter: Secondary | ICD-10-CM | POA: Insufficient documentation

## 2015-04-28 DIAGNOSIS — Y9389 Activity, other specified: Secondary | ICD-10-CM | POA: Insufficient documentation

## 2015-04-28 DIAGNOSIS — R200 Anesthesia of skin: Secondary | ICD-10-CM | POA: Insufficient documentation

## 2015-04-28 DIAGNOSIS — Z8719 Personal history of other diseases of the digestive system: Secondary | ICD-10-CM | POA: Insufficient documentation

## 2015-04-28 DIAGNOSIS — Y9289 Other specified places as the place of occurrence of the external cause: Secondary | ICD-10-CM | POA: Insufficient documentation

## 2015-04-28 DIAGNOSIS — Z720 Tobacco use: Secondary | ICD-10-CM | POA: Insufficient documentation

## 2015-04-28 DIAGNOSIS — W108XXA Fall (on) (from) other stairs and steps, initial encounter: Secondary | ICD-10-CM | POA: Insufficient documentation

## 2015-04-28 DIAGNOSIS — Z9889 Other specified postprocedural states: Secondary | ICD-10-CM | POA: Insufficient documentation

## 2015-04-28 DIAGNOSIS — G8929 Other chronic pain: Secondary | ICD-10-CM | POA: Insufficient documentation

## 2015-04-28 MED ORDER — DICLOFENAC SODIUM 75 MG PO TBEC: 75.0000 mg | DELAYED_RELEASE_TABLET | Freq: Two times a day (BID) | ORAL | Status: DC

## 2015-04-28 MED ORDER — OXYCODONE-ACETAMINOPHEN 5-325 MG PO TABS
2.0000 | ORAL_TABLET | Freq: Once | ORAL | Status: AC
  Administered 2015-04-28: 2 via ORAL
  Filled 2015-04-28: qty 2

## 2015-04-28 MED ORDER — KETOROLAC TROMETHAMINE 60 MG/2ML IM SOLN
60.0000 mg | Freq: Once | INTRAMUSCULAR | Status: AC
  Administered 2015-04-28: 60 mg via INTRAMUSCULAR
  Filled 2015-04-28: qty 2

## 2015-04-28 MED ORDER — HYDROCODONE-ACETAMINOPHEN 5-325 MG PO TABS: 1.0000 | ORAL_TABLET | ORAL | Status: DC | PRN

## 2015-04-28 NOTE — ED Provider Notes (Signed)
CSN: [id number]     Arrival date & time 04/28/15  2103 History  By signing my name below, I, Lyndel Safe, attest that this documentation has been prepared under the direction and in the presence of Marily Memos, MD. Electronically Signed: Lyndel Safe, ED Scribe. 04/28/2015. 10:39 PM.   Chief Complaint  Patient presents with  . Back Pain   Patient is a 34 y.o. female presenting with back pain. The history is provided by the patient and the spouse. No language interpreter was used.  Back Pain Location:  Lumbar spine Radiates to:  L posterior upper leg and R posterior upper leg Pain severity:  Moderate Pain is:  Same all the time Onset quality:  Sudden Duration:  4 days Timing:  Constant Progression:  Worsening Chronicity:  New Context: falling   Relieved by:  Nothing Worsened by:  Ambulation Ineffective treatments:  OTC medications Associated symptoms: leg pain and numbness    HPI Comments: Cathy [name] is a 34 y.o. female, with a PMhx of RA and osteoarthritis, who presents to the Emergency Department complaining of gradually worsening lumbar back pain with intermittent radiation of pain to BLE onset 4 days ago s/p fall. Pt reports slipping down 6 stairs that were wet from rain 4 days ago when she fell on her back and left hip.  She describes her intermittent BLE pain to be sharp at times and throbbing at times. Pt also explains an intermittent numbness then pins and needles feeling from her left shoulder to left elbow. Husband reports pt fell again today out of their car onto her face. She reports associated intermittent BLE weakness which she attributes to her fall today. Husband reports, pt has been having difficulty ambulating after the fall 4 days ago. She has been taking tylenol with no relief. No overlying skin changes.   Past Medical History  Diagnosis Date  . GERD (gastroesophageal reflux disease)   . Heart murmur of newborn   . Chronic knee pain    Past Surgical  History  Procedure Laterality Date  . Cholecystectomy    . Tubal ligation    . Foot surgery    . Abdominal hysterectomy      total   History reviewed. No pertinent family history. Social History  Substance Use Topics  . Smoking status: Current Every Day Smoker -- 0.50 packs/day    Types: Cigarettes  . Smokeless tobacco: None  . Alcohol Use: No   OB History    No data available     Review of Systems  Musculoskeletal: Positive for back pain and gait problem.  Skin: Negative for color change and wound.  Neurological: Positive for numbness.  All other systems reviewed and are negative.  Allergies  Review of patient's allergies indicates no known allergies.  Home Medications   Prior to Admission medications   Medication Sig Start Date End Date Taking? Authorizing Provider  acetaminophen (TYLENOL) 500 MG tablet Take 500-1,500 mg by mouth every 6 (six) hours as needed for mild pain or moderate pain.    Historical Provider, MD  diclofenac (VOLTAREN) 75 MG EC tablet Take 1 tablet (75 mg total) by mouth 2 (two) times daily. 04/28/15   Marily Memos, MD  HYDROcodone-acetaminophen (NORCO/VICODIN) 5-325 MG tablet Take 1 tablet by mouth every 4 (four) hours as needed. 04/28/15   Marily Memos, MD  sulfamethoxazole-trimethoprim (BACTRIM DS,SEPTRA DS) 800-160 MG per tablet Take 1 tablet by mouth 2 (two) times daily. 03/31/15   Devoria Albe, MD   BP  113/63 mmHg  Pulse 94  Temp(Src) 98.4 F (36.9 C) (Oral)  Resp 20  Ht 5' (1.524 m)  Wt 130 lb (58.968 kg)  BMI 25.39 kg/m2  SpO2 97% Physical Exam  Constitutional: She is oriented to person, place, and time. She appears well-developed and well-nourished.  HENT:  Head: Normocephalic and atraumatic.  Mouth/Throat: Oropharynx is clear and moist.  Eyes: Conjunctivae and EOM are normal. Pupils are equal, round, and reactive to light.  Neck: Neck supple.  Cardiovascular: Normal rate and regular rhythm.   Pulmonary/Chest: No respiratory distress.  She has no wheezes.  Abdominal: Soft. There is no tenderness.  Musculoskeletal: Normal range of motion. She exhibits no edema or tenderness.  No midline tenderness of C, T, or L spine, no step-off or deformities, no paraspinal tenderness of C, T or L.  Neurological: She is alert and oriented to person, place, and time. She has normal strength and normal reflexes. She displays normal reflexes. No cranial nerve deficit or sensory deficit. She exhibits normal muscle tone.  Skin: Skin is warm and dry.  Psychiatric: She has a normal mood and affect.  Nursing note and vitals reviewed.   ED Course  Procedures  DIAGNOSTIC STUDIES: Oxygen Saturation is 97% on RA, normal by my interpretation.    COORDINATION OF CARE: 9:51 PM Discussed treatment plan with pt at bedside and pt agreed to plan.  Imaging Review Dg Chest 2 View  04/28/2015   CLINICAL DATA:  Larey Seat down stairs 4 days ago, with persistent pain.  EXAM: CHEST  2 VIEW  COMPARISON:  02/02/2015  FINDINGS: The heart size and mediastinal contours are within normal limits. Both lungs are clear. The visualized skeletal structures are unremarkable.  IMPRESSION: No active cardiopulmonary disease.   Electronically Signed   By: Ellery Plunk M.D.   On: 04/28/2015 22:34   Dg Cervical Spine Complete  04/28/2015   CLINICAL DATA:  Persistent pain after falling down stairs 4 days ago.  EXAM: CERVICAL SPINE  4+ VIEWS  COMPARISON:  None.  FINDINGS: There is reversal of cervical lordosis. There is no evidence of cervical spine fracture or prevertebral soft tissue swelling. Alignment is normal. No other significant bone abnormalities are identified.  IMPRESSION: Negative cervical spine radiographs.   Electronically Signed   By: Ellery Plunk M.D.   On: 04/28/2015 22:36   Dg Thoracic Spine W/swimmers  04/28/2015   CLINICAL DATA:  Status post fall down several stairs. Upper back pain. Initial encounter.  EXAM: THORACIC SPINE - 3 VIEWS  COMPARISON:  Chest  radiograph performed 02/02/2015  FINDINGS: There is no evidence of fracture or subluxation. Vertebral bodies demonstrate normal height and alignment. Intervertebral disc spaces are preserved.  The visualized portions of both lungs are clear. The mediastinum is unremarkable in appearance. Clips are noted within the right upper quadrant, reflecting prior cholecystectomy.  IMPRESSION: No evidence of fracture or subluxation along the thoracic spine.   Electronically Signed   By: Roanna Raider M.D.   On: 04/28/2015 22:31   Dg Lumbar Spine Complete  04/28/2015   CLINICAL DATA:  Persistent pain after falling down a few stairs 4 days ago. Husband states that the patient fell again today.  EXAM: LUMBAR SPINE - COMPLETE 4+ VIEW  COMPARISON:  None.  FINDINGS: There is slight left convex curvature at L3. There is no evidence of lumbar spine fracture. Alignment is normal. Intervertebral disc spaces are maintained.  IMPRESSION: Negative.   Electronically Signed   By: Rosey Bath.D.  On: 04/28/2015 22:33   Dg Pelvis 1-2 Views  04/28/2015   CLINICAL DATA:  Persistent pain after falling down stairs 4 days ago  EXAM: PELVIS - 1-2 VIEW  COMPARISON:  None.  FINDINGS: There is no evidence of pelvic fracture or diastasis. No pelvic bone lesions are seen.  IMPRESSION: Negative.   Electronically Signed   By: Ellery Plunk M.D.   On: 04/28/2015 22:36   I have personally reviewed and evaluated these images results as part of my medical decision-making.    MDM   Final diagnoses:  Bilateral low back pain without sciatica   There is history of female back pain progressively worsened after a fall a few days ago. Also supposedly fell today and smashed her face and around however has no evidence of this injury. Neurologically she is intact. Have low suspicion for injury so x-rays were done which were negative. Pain improved on the emergency department. I'll discharge her on symptomatically treatment as well. She'll  need to follow with her doctor for any further workup.  I have personally and contemperaneously reviewed labs and imaging and used in my decision making as above.   A medical screening exam was performed and I feel the patient has had an appropriate workup for their chief complaint at this time and likelihood of emergent condition existing is low. They have been counseled on decision, discharge, follow up and which symptoms necessitate immediate return to the emergency department. They or their family verbally stated understanding and agreement with plan and discharged in stable condition.    I personally performed the services described in this documentation, which was scribed in my presence. The recorded information has been reviewed and is accurate.   Marily Memos, MD 04/29/15 [phone]

## 2015-04-28 NOTE — ED Notes (Signed)
MD at bedside. 

## 2015-04-28 NOTE — ED Notes (Addendum)
Pt states she fell down a couple of stairs 4 days ago and landed on her right hip/lower back. Pt c/o worsening pain.  Pt states left arm from shoulder to elbow is numb and tingly. Husband states that the pt fell face first out of the [place] today. Husband states pt has a hard time walking.

## 2015-05-05 ENCOUNTER — Encounter (HOSPITAL_COMMUNITY): Payer: Self-pay | Admitting: Emergency Medicine

## 2015-05-05 ENCOUNTER — Emergency Department (HOSPITAL_COMMUNITY)
Admission: EM | Admit: 2015-05-05 | Discharge: 2015-05-05 | Disposition: A | Discharge status: Home / Self Care | Payer: Self-pay | Attending: Emergency Medicine | Admitting: Emergency Medicine

## 2015-05-05 DIAGNOSIS — R300 Dysuria: Secondary | ICD-10-CM | POA: Insufficient documentation

## 2015-05-05 DIAGNOSIS — Z792 Long term (current) use of antibiotics: Secondary | ICD-10-CM | POA: Insufficient documentation

## 2015-05-05 DIAGNOSIS — Z8719 Personal history of other diseases of the digestive system: Secondary | ICD-10-CM | POA: Insufficient documentation

## 2015-05-05 DIAGNOSIS — Z791 Long term (current) use of non-steroidal anti-inflammatories (NSAID): Secondary | ICD-10-CM | POA: Insufficient documentation

## 2015-05-05 DIAGNOSIS — M549 Dorsalgia, unspecified: Secondary | ICD-10-CM | POA: Insufficient documentation

## 2015-05-05 DIAGNOSIS — G8929 Other chronic pain: Secondary | ICD-10-CM | POA: Insufficient documentation

## 2015-05-05 DIAGNOSIS — R1030 Lower abdominal pain, unspecified: Secondary | ICD-10-CM | POA: Insufficient documentation

## 2015-05-05 DIAGNOSIS — R011 Cardiac murmur, unspecified: Secondary | ICD-10-CM | POA: Insufficient documentation

## 2015-05-05 DIAGNOSIS — Z720 Tobacco use: Secondary | ICD-10-CM | POA: Insufficient documentation

## 2015-05-05 LAB — URINE MICROSCOPIC-ADD ON

## 2015-05-05 LAB — URINALYSIS, ROUTINE W REFLEX MICROSCOPIC
BILIRUBIN URINE: NEGATIVE
GLUCOSE, UA: NEGATIVE mg/dL
KETONES UR: NEGATIVE mg/dL
Leukocytes, UA: NEGATIVE
Nitrite: NEGATIVE
PROTEIN: NEGATIVE mg/dL
Specific Gravity, Urine: >1.03 — ABNORMAL HIGH (ref 1.005–1.030)
UROBILINOGEN UA: 0.2 mg/dL (ref 0.0–1.0)
pH: 5.5 (ref 5.0–8.0)

## 2015-05-05 MED ORDER — PHENAZOPYRIDINE HCL 100 MG PO TABS
100.0000 mg | ORAL_TABLET | Freq: Once | ORAL | Status: AC
  Administered 2015-05-05: 100 mg via ORAL
  Filled 2015-05-05: qty 1

## 2015-05-05 MED ORDER — PHENAZOPYRIDINE HCL 100 MG PO TABS: 100.0000 mg | ORAL_TABLET | Freq: Three times a day (TID) | ORAL | Status: DC | PRN

## 2015-05-05 MED ORDER — IBUPROFEN 800 MG PO TABS
800.0000 mg | ORAL_TABLET | Freq: Once | ORAL | Status: AC
  Administered 2015-05-05: 800 mg via ORAL
  Filled 2015-05-05: qty 1

## 2015-05-05 MED ORDER — IBUPROFEN 800 MG PO TABS: 800.0000 mg | ORAL_TABLET | Freq: Three times a day (TID) | ORAL | Status: DC

## 2015-05-05 MED ORDER — ACETAMINOPHEN 325 MG PO TABS
650.0000 mg | ORAL_TABLET | Freq: Once | ORAL | Status: AC
  Administered 2015-05-05: 650 mg via ORAL
  Filled 2015-05-05: qty 2

## 2015-05-05 MED ORDER — ONDANSETRON HCL 4 MG PO TABS
4.0000 mg | ORAL_TABLET | Freq: Once | ORAL | Status: AC
  Administered 2015-05-05: 4 mg via ORAL
  Filled 2015-05-05: qty 1

## 2015-05-05 NOTE — ED Notes (Signed)
Having pain urination since last night, denies foul smell to urine or any vaginal discharge.  C/o burning when voiding and constant lower bladder pain.

## 2015-05-05 NOTE — ED Provider Notes (Signed)
CSN: [id number]     Arrival date & time 05/05/15  1553 History   None    Chief Complaint  Patient presents with  . Urinary Tract Infection     (Consider location/radiation/quality/duration/timing/severity/associated sxs/prior Treatment) Patient is a 34 y.o. female presenting with urinary tract infection. The history is provided by the patient.  Urinary Tract Infection Pain quality:  Burning Pain severity:  Moderate Onset quality:  Gradual Duration:  1 day Timing:  Intermittent Progression:  Worsening Chronicity:  New Recent urinary tract infections: no   Relieved by:  Nothing Worsened by:  Nothing tried Ineffective treatments:  None tried Urinary symptoms: no discolored urine, no foul-smelling urine and no bladder incontinence   Associated symptoms: no fever, no nausea and no vomiting   Risk factors: no hx of pyelonephritis     Past Medical History  Diagnosis Date  . GERD (gastroesophageal reflux disease)   . Heart murmur of newborn   . Chronic knee pain    Past Surgical History  Procedure Laterality Date  . Cholecystectomy    . Tubal ligation    . Foot surgery    . Abdominal hysterectomy      total   History reviewed. No pertinent family history. Social History  Substance Use Topics  . Smoking status: Current Every Day Smoker -- 0.50 packs/day    Types: Cigarettes  . Smokeless tobacco: None  . Alcohol Use: No   OB History    No data available     Review of Systems  Constitutional: Negative for fever.  Gastrointestinal: Negative for nausea and vomiting.  Genitourinary: Positive for dysuria.  Musculoskeletal: Positive for back pain.  All other systems reviewed and are negative.     Allergies  Review of patient's allergies indicates no known allergies.  Home Medications   Prior to Admission medications   Medication Sig Start Date End Date Taking? Authorizing Provider  acetaminophen (TYLENOL) 500 MG tablet Take 500-1,500 mg by mouth every 6 (six)  hours as needed for mild pain or moderate pain.    Historical Provider, MD  diclofenac (VOLTAREN) 75 MG EC tablet Take 1 tablet (75 mg total) by mouth 2 (two) times daily. 04/28/15   Marily Memos, MD  HYDROcodone-acetaminophen (NORCO/VICODIN) 5-325 MG tablet Take 1 tablet by mouth every 4 (four) hours as needed. 04/28/15   Marily Memos, MD  sulfamethoxazole-trimethoprim (BACTRIM DS,SEPTRA DS) 800-160 MG per tablet Take 1 tablet by mouth 2 (two) times daily. 03/31/15   Devoria Albe, MD   BP 121/83 mmHg  Pulse 88  Temp(Src) 98.2 F (36.8 C) (Oral)  Resp 14  Ht 4\' 11"  (1.499 m)  Wt 125 lb (56.7 kg)  BMI 25.23 kg/m2  SpO2 99% Physical Exam  Constitutional: She is oriented to person, place, and time. She appears well-developed and well-nourished.  Non-toxic appearance.  HENT:  Head: Normocephalic.  Right Ear: Tympanic membrane and external ear normal.  Left Ear: Tympanic membrane and external ear normal.  Eyes: EOM and lids are normal. Pupils are equal, round, and reactive to light.  Neck: Normal range of motion. Neck supple. Carotid bruit is not present.  Cardiovascular: Normal rate, regular rhythm, normal heart sounds, intact distal pulses and normal pulses.   Pulmonary/Chest: Breath sounds normal. No respiratory distress.  Abdominal: Soft. Bowel sounds are normal. There is tenderness in the suprapubic area. There is no guarding.  Musculoskeletal: Normal range of motion.  Lymphadenopathy:       Head (right side): No submandibular adenopathy present.  Head (left side): No submandibular adenopathy present.    She has no cervical adenopathy.  Neurological: She is alert and oriented to person, place, and time. She has normal strength. No cranial nerve deficit or sensory deficit.  Skin: Skin is warm and dry.  Psychiatric: She has a normal mood and affect. Her speech is normal.  Nursing note and vitals reviewed.   ED Course  Procedures (including critical care time) Labs Review Labs  Reviewed  URINALYSIS, ROUTINE W REFLEX MICROSCOPIC (NOT AT [facility])    Imaging Review No results found. I have personally reviewed and evaluated these images and lab results as part of my medical decision-making.   EKG Interpretation None      MDM  Urinalysis reveals a clear yellow specimen with a specific gravity greater than 1.030. The nitrates and leukocyte esterase are both negative. Patient complains of burning sensation and heaviness in the bladder area. The urine will be sent for culture. Prescription for Pyridium and Amoxil given to the patient.    Final diagnoses:  None    **I have reviewed nursing notes, vital signs, and all appropriate lab and imaging results for this patient.Ivery Quale, PA-C 05/06/15 2001  Lorre Nick, MD 05/06/15 [phone]

## 2015-05-05 NOTE — Discharge Instructions (Signed)
Your urine test is negative for acute infection or evidence of kidney stone. Please use pyridium and ibuprofen for discomfort. Please see MD at the [facility] for recheck of your urine if not improving. Dysuria Dysuria is pain or discomfort while urinating. The pain or discomfort may be felt in the tube that carries urine out of the bladder (urethra) or in the surrounding tissue of the genitals. The pain may also be felt in the groin area, lower abdomen, and lower back. You may have to urinate frequently or have the sudden feeling that you have to urinate (urgency). Dysuria can affect both men and women, but is more common in women. Dysuria can be caused by many different things, including:  Urinary tract infection in women.  Infection of the kidney or bladder.  Kidney stones or bladder stones.  Certain sexually transmitted infections (STIs), such as chlamydia.  Dehydration.  Inflammation of the vagina.  Use of certain medicines.  Use of certain soaps or scented products that cause irritation. HOME CARE INSTRUCTIONS Watch your dysuria for any changes. The following actions may help to reduce any discomfort you are feeling:  Drink enough fluid to keep your urine clear or pale yellow.  Empty your bladder often. Avoid holding urine for long periods of time.  After a bowel movement or urination, women should cleanse from front to back, using each tissue only once.  Empty your bladder after sexual intercourse.  Take medicines only as directed by your health care provider.  If you were prescribed an antibiotic medicine, finish it all even if you start to feel better.  Avoid caffeine, tea, and alcohol. They can irritate the bladder and make dysuria worse. In men, alcohol may irritate the prostate.  Keep all follow-up visits as directed by your health care provider. This is important.  If you had any tests done to find the cause of dysuria, it is your responsibility to  obtain your test results. Ask the lab or department performing the test when and how you will get your results. Talk with your health care provider if you have any questions about your results. SEEK MEDICAL CARE IF:  You develop pain in your back or sides.  You have a fever.  You have nausea or vomiting.  You have blood in your urine.  You are not urinating as often as you usually do. SEEK IMMEDIATE MEDICAL CARE IF:  You pain is severe and not relieved with medicines.  You are unable to hold down any fluids.  You or someone else notices a change in your mental function.  You have a rapid heartbeat at rest.  You have shaking or chills.  You feel extremely weak.   This information is not intended to replace advice given to you by your health care provider. Make sure you discuss any questions you have with your health care provider.   Document Released: 04/08/2004 Document Revised: 08/01/2014 Document Reviewed: 03/06/2014 Elsevier Interactive Patient Education [employer]

## 2015-05-07 LAB — URINE CULTURE

## 2017-04-19 ENCOUNTER — Encounter (HOSPITAL_COMMUNITY): Payer: Self-pay | Admitting: Emergency Medicine

## 2017-04-19 ENCOUNTER — Emergency Department (HOSPITAL_COMMUNITY): Payer: Self-pay

## 2017-04-19 ENCOUNTER — Inpatient Hospital Stay (HOSPITAL_COMMUNITY)
Admission: EM | Admit: 2017-04-19 | Discharge: 2017-04-21 | DRG: 580 | Discharge status: Against Medical Advice | Payer: Self-pay | Attending: Internal Medicine | Admitting: Internal Medicine

## 2017-04-19 DIAGNOSIS — L02512 Cutaneous abscess of left hand: Principal | ICD-10-CM | POA: Diagnosis present

## 2017-04-19 DIAGNOSIS — S61052A Open bite of left thumb without damage to nail, initial encounter: Secondary | ICD-10-CM | POA: Diagnosis present

## 2017-04-19 DIAGNOSIS — K219 Gastro-esophageal reflux disease without esophagitis: Secondary | ICD-10-CM | POA: Diagnosis present

## 2017-04-19 DIAGNOSIS — W540XXA Bitten by dog, initial encounter: Secondary | ICD-10-CM

## 2017-04-19 DIAGNOSIS — Z720 Tobacco use: Secondary | ICD-10-CM | POA: Diagnosis present

## 2017-04-19 DIAGNOSIS — Z8261 Family history of arthritis: Secondary | ICD-10-CM

## 2017-04-19 DIAGNOSIS — L03012 Cellulitis of left finger: Secondary | ICD-10-CM | POA: Diagnosis present

## 2017-04-19 DIAGNOSIS — Z9851 Tubal ligation status: Secondary | ICD-10-CM

## 2017-04-19 DIAGNOSIS — M25569 Pain in unspecified knee: Secondary | ICD-10-CM | POA: Diagnosis present

## 2017-04-19 DIAGNOSIS — M659 Synovitis and tenosynovitis, unspecified: Secondary | ICD-10-CM | POA: Diagnosis present

## 2017-04-19 DIAGNOSIS — Z23 Encounter for immunization: Secondary | ICD-10-CM

## 2017-04-19 DIAGNOSIS — G8929 Other chronic pain: Secondary | ICD-10-CM | POA: Diagnosis present

## 2017-04-19 DIAGNOSIS — F17210 Nicotine dependence, cigarettes, uncomplicated: Secondary | ICD-10-CM | POA: Diagnosis present

## 2017-04-19 DIAGNOSIS — L03114 Cellulitis of left upper limb: Secondary | ICD-10-CM | POA: Diagnosis present

## 2017-04-19 DIAGNOSIS — Z90710 Acquired absence of both cervix and uterus: Secondary | ICD-10-CM

## 2017-04-19 DIAGNOSIS — M009 Pyogenic arthritis, unspecified: Secondary | ICD-10-CM | POA: Diagnosis present

## 2017-04-19 DIAGNOSIS — M069 Rheumatoid arthritis, unspecified: Secondary | ICD-10-CM | POA: Diagnosis present

## 2017-04-19 HISTORY — DX: Rheumatoid arthritis, unspecified: M06.9

## 2017-04-19 LAB — BASIC METABOLIC PANEL
ANION GAP: 11 (ref 5–15)
BUN: 13 mg/dL (ref 6–20)
CHLORIDE: 103 mmol/L (ref 101–111)
CO2: 24 mmol/L (ref 22–32)
Calcium: 8.8 mg/dL — ABNORMAL LOW (ref 8.9–10.3)
Creatinine, Ser: 0.95 mg/dL (ref 0.44–1.00)
GFR calc Af Amer: >60 mL/min (ref >60)
GLUCOSE: 92 mg/dL (ref 65–99)
POTASSIUM: 3.5 mmol/L (ref 3.5–5.1)
Sodium: 138 mmol/L (ref 135–145)

## 2017-04-19 LAB — CBC WITH DIFFERENTIAL/PLATELET
BASOS ABS: 0 10*3/uL (ref 0.0–0.1)
Basophils Relative: 0 %
EOS PCT: 3 %
Eosinophils Absolute: 0.3 10*3/uL (ref 0.0–0.7)
HCT: 43.4 % (ref 36.0–46.0)
HEMOGLOBIN: 14.2 g/dL (ref 12.0–15.0)
LYMPHS PCT: 31 %
Lymphs Abs: 3.7 10*3/uL (ref 0.7–4.0)
MCH: 31.1 pg (ref 26.0–34.0)
MCHC: 32.7 g/dL (ref 30.0–36.0)
MCV: 95 fL (ref 78.0–100.0)
Monocytes Absolute: 1.2 10*3/uL — ABNORMAL HIGH (ref 0.1–1.0)
Monocytes Relative: 10 %
NEUTROS ABS: 6.7 10*3/uL (ref 1.7–7.7)
NEUTROS PCT: 56 %
PLATELETS: 265 10*3/uL (ref 150–400)
RBC: 4.57 MIL/uL (ref 3.87–5.11)
RDW: 13.7 % (ref 11.5–15.5)
WBC: 11.9 10*3/uL — AB (ref 4.0–10.5)

## 2017-04-19 MED ORDER — KETOROLAC TROMETHAMINE 30 MG/ML IJ SOLN
30.0000 mg | Freq: Once | INTRAMUSCULAR | Status: AC
  Administered 2017-04-19: 30 mg via INTRAVENOUS
  Filled 2017-04-19: qty 1

## 2017-04-19 MED ORDER — CIPROFLOXACIN IN D5W 400 MG/200ML IV SOLN
400.0000 mg | Freq: Once | INTRAVENOUS | Status: AC
  Administered 2017-04-19: 400 mg via INTRAVENOUS
  Filled 2017-04-19: qty 200

## 2017-04-19 MED ORDER — MORPHINE SULFATE (PF) 4 MG/ML IV SOLN
4.0000 mg | Freq: Once | INTRAVENOUS | Status: AC
  Administered 2017-04-19: 4 mg via INTRAVENOUS
  Filled 2017-04-19: qty 1

## 2017-04-19 MED ORDER — SODIUM CHLORIDE 0.9 % IV SOLN
3.0000 g | Freq: Once | INTRAVENOUS | Status: AC
  Administered 2017-04-19: 3 g via INTRAVENOUS
  Filled 2017-04-19: qty 3

## 2017-04-19 MED ORDER — CLINDAMYCIN PHOSPHATE 300 MG/50ML IV SOLN
300.0000 mg | Freq: Once | INTRAVENOUS | Status: AC
  Administered 2017-04-19: 300 mg via INTRAVENOUS
  Filled 2017-04-19: qty 50

## 2017-04-19 MED ORDER — TETANUS-DIPHTH-ACELL PERTUSSIS 5-2.5-18.5 LF-MCG/0.5 IM SUSP
0.5000 mL | Freq: Once | INTRAMUSCULAR | Status: AC
  Administered 2017-04-19: 0.5 mL via INTRAMUSCULAR
  Filled 2017-04-19: qty 0.5

## 2017-04-19 NOTE — ED Notes (Signed)
Pt c/o itching at site when Cipro was started. Cipro stopped MD aware.

## 2017-04-19 NOTE — ED Provider Notes (Signed)
[facility]-EMERGENCY DEPT Provider Note   CSN: [id number] Arrival date & time: 04/19/17  2036     History   Chief Complaint Chief Complaint  Patient presents with  . Animal Bite    HPI Cathy [name] is a 36 y.o. female.  Domestic dog bite to left thumb yesterday. Now with pain, redness, difficulty flexing thumb. Immunizations were up-to-date of the animals. No history of diabetes. She does have rheumatoid arthritis.  Severity of symptoms is moderate.      Past Medical History:  Diagnosis Date  . Chronic knee pain   . GERD (gastroesophageal reflux disease)   . Heart murmur of newborn   . RA (rheumatoid arthritis) [facility])     Patient Active Problem List   Diagnosis Date Noted  . Cellulitis of left hand 04/19/2017    Past Surgical History:  Procedure Laterality Date  . ABDOMINAL HYSTERECTOMY     total  . CHOLECYSTECTOMY    . FOOT SURGERY    . TUBAL LIGATION      OB History    No data available       Home Medications    Prior to Admission medications   Not on File    Family History History reviewed. No pertinent family history.  Social History Social History  Substance Use Topics  . Smoking status: Current Every Day Smoker    Packs/day: 0.50    Types: Cigarettes  . Smokeless tobacco: Never Used  . Alcohol use No     Allergies   Patient has no known allergies.   Review of Systems Review of Systems  All other systems reviewed and are negative.    Physical Exam Updated Vital Signs BP (!) 138/93   Pulse 85   Temp 98.6 F (37 C)   Resp 18   Ht 4\' 10"  (1.473 m)   Wt 72.6 kg (160 lb)   SpO2 100%   BMI 33.44 kg/m   Physical Exam  Constitutional: She is oriented to person, place, and time. She appears well-developed and well-nourished.  HENT:  Head: Normocephalic and atraumatic.  Eyes: Conjunctivae are normal.  Neck: Neck supple.  Cardiovascular: Normal rate and regular rhythm.   Pulmonary/Chest: Effort normal and breath sounds  normal.  Abdominal: Soft. Bowel sounds are normal.  Musculoskeletal:  Right hand: Bite marks on distal phalanx both dorsally and volarly.  More significant erythema on the dorsal aspect of the thumb. Unable to flex the thumb.  Neurological: She is alert and oriented to person, place, and time.  Skin: Skin is warm and dry.  Psychiatric: She has a normal mood and affect. Her behavior is normal.  Nursing note and vitals reviewed.    ED Treatments / Results  Labs (all labs ordered are listed, but only abnormal results are displayed) Labs Reviewed  CBC WITH DIFFERENTIAL/PLATELET - Abnormal; Notable for the following:       Result Value   WBC 11.9 (*)    Monocytes Absolute 1.2 (*)    All other components within normal limits  BASIC METABOLIC PANEL - Abnormal; Notable for the following:    Calcium 8.8 (*)    All other components within normal limits    EKG  EKG Interpretation None       Radiology Dg Finger Thumb Left  Result Date: 04/19/2017 CLINICAL DATA:  6 y/o F; dog bite to the middle left thumb, initial encounter. EXAM: LEFT THUMB 2+V COMPARISON:  None. FINDINGS: There is no evidence of fracture or dislocation. There is  no evidence of arthropathy or other focal bone abnormality. IMPRESSION: No acute fracture or dislocation. Electronically Signed   By: Mitzi Hansen M.D.   On: 04/19/2017 22:15    Procedures Procedures (including critical care time)  Medications Ordered in ED Medications  Ampicillin-Sulbactam (UNASYN) 3 g in sodium chloride 0.9 % 100 mL IVPB (3 g Intravenous New Bag/Given 04/19/17 2301)  morphine 4 MG/ML injection 4 mg (not administered)  clindamycin (CLEOCIN) IVPB 300 mg (0 mg Intravenous Stopped 04/19/17 2213)  ciprofloxacin (CIPRO) IVPB 400 mg (0 mg Intravenous Stopped 04/19/17 2218)  morphine 4 MG/ML injection 4 mg (4 mg Intravenous Given 04/19/17 2150)  Tdap (BOOSTRIX) injection 0.5 mL (0.5 mLs Intramuscular Given 04/19/17 2215)     Initial  Impression / Assessment and Plan / ED Course  I have reviewed the triage vital signs and the nursing notes.  Pertinent labs & imaging results that were available during my care of the patient were reviewed by me and considered in my medical decision making (see chart for details).     Discussed clinical course with Dr. Fuller Canada, orthopedics. We'll start Unasyn. Tetanus update. Admit to general medicine.  Final Clinical Impressions(s) / ED Diagnoses   Final diagnoses:  Cellulitis of left thumb    New Prescriptions New Prescriptions   No medications on file     Donnetta Hutching, MD 04/19/17 2309

## 2017-04-19 NOTE — ED Triage Notes (Signed)
Pt c/o dog bite to the left thumb area yesterday. Pt states the dog was a stray and unknown vaccines. Thumb is swollen and red today.

## 2017-04-20 ENCOUNTER — Inpatient Hospital Stay (HOSPITAL_COMMUNITY): Payer: Self-pay | Admitting: Anesthesiology

## 2017-04-20 ENCOUNTER — Encounter (HOSPITAL_COMMUNITY): Payer: Self-pay | Admitting: *Deleted

## 2017-04-20 ENCOUNTER — Encounter (HOSPITAL_COMMUNITY)
Admission: EM | Discharge status: Against Medical Advice | Payer: Self-pay | Source: Home / Self Care | Attending: Internal Medicine

## 2017-04-20 DIAGNOSIS — L03022 Acute lymphangitis of left finger: Secondary | ICD-10-CM

## 2017-04-20 DIAGNOSIS — L03114 Cellulitis of left upper limb: Secondary | ICD-10-CM

## 2017-04-20 DIAGNOSIS — M009 Pyogenic arthritis, unspecified: Secondary | ICD-10-CM

## 2017-04-20 DIAGNOSIS — L03012 Cellulitis of left finger: Secondary | ICD-10-CM

## 2017-04-20 DIAGNOSIS — K219 Gastro-esophageal reflux disease without esophagitis: Secondary | ICD-10-CM

## 2017-04-20 DIAGNOSIS — Z720 Tobacco use: Secondary | ICD-10-CM

## 2017-04-20 HISTORY — PX: INCISION AND DRAINAGE: SHX5863

## 2017-04-20 LAB — CBC
HCT: 40.1 % (ref 36.0–46.0)
Hemoglobin: 12.7 g/dL (ref 12.0–15.0)
MCH: 30.5 pg (ref 26.0–34.0)
MCHC: 31.7 g/dL (ref 30.0–36.0)
MCV: 96.4 fL (ref 78.0–100.0)
Platelets: 192 10*3/uL (ref 150–400)
RBC: 4.16 MIL/uL (ref 3.87–5.11)
RDW: 13.9 % (ref 11.5–15.5)
WBC: 6 10*3/uL (ref 4.0–10.5)

## 2017-04-20 LAB — SURGICAL PCR SCREEN
MRSA, PCR: NEGATIVE
STAPHYLOCOCCUS AUREUS: NEGATIVE

## 2017-04-20 LAB — CREATININE, SERUM: Creatinine, Ser: 0.77 mg/dL (ref 0.44–1.00)

## 2017-04-20 SURGERY — INCISION AND DRAINAGE
Anesthesia: General | Site: Thumb | Laterality: Left

## 2017-04-20 MED ORDER — FENTANYL CITRATE (PF) 100 MCG/2ML IJ SOLN
INTRAMUSCULAR | Status: AC
  Filled 2017-04-20: qty 2

## 2017-04-20 MED ORDER — EPHEDRINE SULFATE 50 MG/ML IJ SOLN
INTRAMUSCULAR | Status: DC | PRN
  Administered 2017-04-20: 10 mg via INTRAVENOUS

## 2017-04-20 MED ORDER — LACTATED RINGERS IV SOLN
INTRAVENOUS | Status: DC
  Administered 2017-04-20 (×2): via INTRAVENOUS

## 2017-04-20 MED ORDER — LACTATED RINGERS IV SOLN: INTRAVENOUS | Status: DC | PRN

## 2017-04-20 MED ORDER — CHLORHEXIDINE GLUCONATE 4 % EX LIQD
60.0000 mL | Freq: Once | CUTANEOUS | Status: AC
  Administered 2017-04-20: 4 via TOPICAL
  Filled 2017-04-20: qty 60

## 2017-04-20 MED ORDER — SODIUM CHLORIDE 0.9 % IR SOLN
Status: DC | PRN
  Administered 2017-04-20: 1000 mL

## 2017-04-20 MED ORDER — ONDANSETRON HCL 4 MG/2ML IJ SOLN
4.0000 mg | Freq: Once | INTRAMUSCULAR | Status: AC
  Administered 2017-04-20: 4 mg via INTRAVENOUS

## 2017-04-20 MED ORDER — ENOXAPARIN SODIUM 30 MG/0.3ML ~~LOC~~ SOLN
30.0000 mg | SUBCUTANEOUS | Status: DC
  Administered 2017-04-20: 30 mg via SUBCUTANEOUS
  Filled 2017-04-20: qty 0.3

## 2017-04-20 MED ORDER — OXYCODONE-ACETAMINOPHEN 5-325 MG PO TABS
1.0000 | ORAL_TABLET | ORAL | Status: DC | PRN
  Administered 2017-04-20: 1 via ORAL
  Filled 2017-04-20: qty 1

## 2017-04-20 MED ORDER — POVIDONE-IODINE 10 % EX SWAB
2.0000 "application " | Freq: Once | CUTANEOUS | Status: AC
  Administered 2017-04-20: 2 via TOPICAL

## 2017-04-20 MED ORDER — ACETAMINOPHEN 325 MG PO TABS
650.0000 mg | ORAL_TABLET | Freq: Four times a day (QID) | ORAL | Status: DC | PRN
  Administered 2017-04-20: 650 mg via ORAL
  Filled 2017-04-20: qty 2

## 2017-04-20 MED ORDER — NICOTINE 21 MG/24HR TD PT24
21.0000 mg | MEDICATED_PATCH | Freq: Every day | TRANSDERMAL | Status: DC | PRN
  Administered 2017-04-20: 21 mg via TRANSDERMAL
  Filled 2017-04-20: qty 1

## 2017-04-20 MED ORDER — ENOXAPARIN SODIUM 40 MG/0.4ML ~~LOC~~ SOLN: 40.0000 mg | SUBCUTANEOUS | Status: DC

## 2017-04-20 MED ORDER — FENTANYL CITRATE (PF) 100 MCG/2ML IJ SOLN
INTRAMUSCULAR | Status: DC | PRN
  Administered 2017-04-20: 50 ug via INTRAVENOUS
  Administered 2017-04-20 (×4): 25 ug via INTRAVENOUS

## 2017-04-20 MED ORDER — HYDROMORPHONE HCL 1 MG/ML IJ SOLN
0.5000 mg | INTRAMUSCULAR | Status: DC | PRN
  Administered 2017-04-20: 0.5 mg via INTRAVENOUS
  Administered 2017-04-20 (×3): 1 mg via INTRAVENOUS
  Administered 2017-04-20: 0.5 mg via INTRAVENOUS
  Administered 2017-04-20 – 2017-04-21 (×6): 1 mg via INTRAVENOUS
  Filled 2017-04-20 (×11): qty 1

## 2017-04-20 MED ORDER — MIDAZOLAM HCL 2 MG/2ML IJ SOLN
1.0000 mg | INTRAMUSCULAR | Status: DC
  Administered 2017-04-20: 2 mg via INTRAVENOUS

## 2017-04-20 MED ORDER — PANTOPRAZOLE SODIUM 40 MG PO TBEC
40.0000 mg | DELAYED_RELEASE_TABLET | Freq: Every day | ORAL | Status: DC
  Administered 2017-04-21: 40 mg via ORAL
  Filled 2017-04-20: qty 1

## 2017-04-20 MED ORDER — LIDOCAINE HCL (CARDIAC) 20 MG/ML IV SOLN
INTRAVENOUS | Status: DC | PRN
  Administered 2017-04-20: 30 mg via INTRAVENOUS

## 2017-04-20 MED ORDER — MORPHINE SULFATE (PF) 4 MG/ML IV SOLN
4.0000 mg | Freq: Once | INTRAVENOUS | Status: AC
  Administered 2017-04-20: 4 mg via INTRAVENOUS
  Filled 2017-04-20: qty 1

## 2017-04-20 MED ORDER — SODIUM CHLORIDE 0.9 % IV SOLN
3.0000 g | Freq: Four times a day (QID) | INTRAVENOUS | Status: DC
  Administered 2017-04-20 – 2017-04-21 (×6): 3 g via INTRAVENOUS
  Filled 2017-04-20 (×19): qty 3

## 2017-04-20 MED ORDER — FENTANYL CITRATE (PF) 100 MCG/2ML IJ SOLN
25.0000 ug | INTRAMUSCULAR | Status: DC | PRN
  Administered 2017-04-20: 50 ug via INTRAVENOUS

## 2017-04-20 MED ORDER — MIDAZOLAM HCL 2 MG/2ML IJ SOLN
INTRAMUSCULAR | Status: AC
  Filled 2017-04-20: qty 2

## 2017-04-20 MED ORDER — KETOROLAC TROMETHAMINE 30 MG/ML IJ SOLN: 30.0000 mg | Freq: Four times a day (QID) | INTRAMUSCULAR | Status: DC | PRN

## 2017-04-20 MED ORDER — PROPOFOL 10 MG/ML IV BOLUS
INTRAVENOUS | Status: DC | PRN
  Administered 2017-04-20: 150 mg via INTRAVENOUS

## 2017-04-20 MED ORDER — ONDANSETRON HCL 4 MG/2ML IJ SOLN
INTRAMUSCULAR | Status: AC
  Filled 2017-04-20: qty 2

## 2017-04-20 SURGICAL SUPPLY — 38 items
BAG HAMPER (MISCELLANEOUS) ×3 IMPLANT
BANDAGE ESMARK 4X12 BL STRL LF (DISPOSABLE) ×1 IMPLANT
BLADE 15 SAFETY STRL DISP (BLADE) ×2 IMPLANT
BNDG CMPR 12X4 ELC STRL LF (DISPOSABLE) ×1
BNDG CMPR MD 5X2 ELC HKLP STRL (GAUZE/BANDAGES/DRESSINGS) ×1
BNDG CONFORM 2 STRL LF (GAUZE/BANDAGES/DRESSINGS) ×2 IMPLANT
BNDG ELASTIC 2 VLCR STRL LF (GAUZE/BANDAGES/DRESSINGS) ×2 IMPLANT
BNDG ESMARK 4X12 BLUE STRL LF (DISPOSABLE) ×3
CLOTH BEACON ORANGE TIMEOUT ST (SAFETY) ×3 IMPLANT
COVER LIGHT HANDLE STERIS (MISCELLANEOUS) ×6 IMPLANT
CUFF TOURNIQUET SINGLE 18IN (TOURNIQUET CUFF) ×3 IMPLANT
DRSG ALLEVYN 2X2 (GAUZE/BANDAGES/DRESSINGS) IMPLANT
ELECT REM PT RETURN 9FT ADLT (ELECTROSURGICAL) ×3
ELECTRODE REM PT RTRN 9FT ADLT (ELECTROSURGICAL) ×1 IMPLANT
GAUZE XEROFORM 1X8 LF (GAUZE/BANDAGES/DRESSINGS) ×2 IMPLANT
GLOVE BIOGEL PI IND STRL 7.0 (GLOVE) ×1 IMPLANT
GLOVE BIOGEL PI INDICATOR 7.0 (GLOVE) ×2
GLOVE SKINSENSE NS SZ8.0 LF (GLOVE) ×2
GLOVE SKINSENSE STRL SZ8.0 LF (GLOVE) ×1 IMPLANT
GLOVE SS N UNI LF 8.5 STRL (GLOVE) ×3 IMPLANT
GOWN STRL REUS W/TWL LRG LVL3 (GOWN DISPOSABLE) ×9 IMPLANT
GOWN STRL REUS W/TWL XL LVL3 (GOWN DISPOSABLE) ×3 IMPLANT
HAND ALUMI LG (SOFTGOODS) ×3 IMPLANT
KIT ROOM TURNOVER APOR (KITS) ×3 IMPLANT
MANIFOLD NEPTUNE II (INSTRUMENTS) ×3 IMPLANT
NS IRRIG 1000ML POUR BTL (IV SOLUTION) ×3 IMPLANT
PACK BASIC LIMB (CUSTOM PROCEDURE TRAY) IMPLANT
PAD ABD 5X9 TENDERSORB (GAUZE/BANDAGES/DRESSINGS) IMPLANT
PAD ARMBOARD 7.5X6 YLW CONV (MISCELLANEOUS) ×3 IMPLANT
PADDING CAST COTTON 6X4 STRL (CAST SUPPLIES) IMPLANT
SET BASIN LINEN APH (SET/KITS/TRAYS/PACK) ×3 IMPLANT
SPONGE GAUZE 2X2 8PLY STER LF (GAUZE/BANDAGES/DRESSINGS) ×3
SPONGE GAUZE 2X2 8PLY STRL LF (GAUZE/BANDAGES/DRESSINGS) ×3 IMPLANT
SUT ETHILON 3 0 FSL (SUTURE) ×2 IMPLANT
SWAB CULTURE LIQ STUART DBL (MISCELLANEOUS) ×2 IMPLANT
SYR BULB IRRIGATION 50ML (SYRINGE) ×3 IMPLANT
TUBE ANAEROBIC PORT A CUL  W/M (MISCELLANEOUS) ×2 IMPLANT
VESSEL LOOPS MAXI RED (MISCELLANEOUS) IMPLANT

## 2017-04-20 NOTE — Transfer of Care (Signed)
Immediate Anesthesia Transfer of Care Note  Patient: Cathy [name]  Procedure(s) Performed: Procedure(s) with comments: INCISION AND DRAINAGE LEFT THUMB (Left) - CULTURE ANEROBIC AEROBIC  Patient Location: PACU  Anesthesia Type:General  Level of Consciousness: awake, alert , oriented and patient cooperative  Airway & Oxygen Therapy: Patient Spontanous Breathing and Patient connected to nasal cannula oxygen  Post-op Assessment: Report given to RN and Post -op Vital signs reviewed and stable  Post vital signs: Reviewed and stable  Last Vitals:  Vitals:   04/20/17 1210 04/20/17 1215  BP: (!) 91/57 (!) 100/52  Pulse:    Resp: 14 14  Temp:    SpO2: 96% 95%    Last Pain:  Vitals:   04/20/17 1140  TempSrc: Oral  PainSc: 4       Patients Stated Pain Goal: 3 (04/20/17 1056)  Complications: No apparent anesthesia complications

## 2017-04-20 NOTE — OR Nursing (Signed)
Pt receiving bolus due to hypotension after being given versed 2mg . Pt sleeping but arouses to name and light touch

## 2017-04-20 NOTE — Anesthesia Postprocedure Evaluation (Signed)
Anesthesia Post Note  Patient: Cathy [name]  Procedure(s) Performed: Procedure(s) (LRB): INCISION AND DRAINAGE LEFT THUMB (Left)  Patient location during evaluation: PACU Anesthesia Type: General Level of consciousness: awake and alert, oriented and patient cooperative Pain management: pain level controlled Vital Signs Assessment: post-procedure vital signs reviewed and stable Respiratory status: spontaneous breathing Cardiovascular status: stable Postop Assessment: no apparent nausea or vomiting Anesthetic complications: no     Last Vitals:  Vitals:   04/20/17 1332 04/20/17 1345  BP: 107/73 122/77  Pulse: 68 70  Resp: 15 15  Temp:    SpO2: 94% 98%    Last Pain:  Vitals:   04/20/17 1332  TempSrc:   PainSc: 6                  [name]

## 2017-04-20 NOTE — Progress Notes (Signed)
PROGRESS NOTE  Cathy [name] EHU:[id number] DOB: 21-Jan-1981 DOA: 04/19/2017 PCP: Patient, No Pcp Per  Brief History:  36 year old female with a history of GERD, irritable bowel syndrome presented after being bitten by a dog on 04/18/2017 when she was trying to break up an altercation with another dog. The patient stated that all the animals were up-to-date with her immunizations including rabies. Over the next 24-48 hours, the patient complained of increasing pain, erythema, and edema of her left hand and thumb to the point where she was not able to flex or extend her thumb without difficulty. The patient has subjective fevers and chills. There was no nausea, vomiting, chest pain, short of breath, diarrhea, abdominal pain, dysuria, hematuria.  The patient was found to have WBC 11.9, but she was afebrile and hemodynamically stable. The patient was started on clindamycin and ciprofloxacin. Apparently, the patient had itching with Ciprofloxacin which was discontinued.  Assessment/Plan: Cellulitis of left hand/Tenosynovitis/Abscess of left hand -concerned about septic arthritis of IP joint of thumb -continue Unasyn -appreciate Ortho -04/20/17--I&D--follow intraop cultures  Tobacco abuse -cessation discussed  GERD -continue PPI    Disposition Plan:   Home 9/28 if stable Family Communication:   Mother updated at bedside 9/27  Consultants:  ortho  Code Status:  FULL  DVT Prophylaxis:  [place] Lovenox   Procedures: As Listed in Progress Note Above  Antibiotics: Unasyn 9/26>>>    Subjective: Patient complains of pain in her left hand. She denies any fevers, chills, chest pain, shortness breath, nausea, vomiting, diarrhea, abdominal pain.  Objective: Vitals:   04/20/17 0117 04/20/17 0529 04/20/17 0750 04/20/17 1140  BP: 113/69 (!) 95/55 108/72 (!) 103/59  Pulse: 60 (!) 56 (!) 58 (!) 50  Resp: 18 18  15   Temp: 98.9 F (37.2 C) 98.3 F (36.8 C) 98.1 F (36.7 C) 98.6 F  (37 C)  TempSrc: Oral Oral Oral Oral  SpO2: 97% 94% 96% 94%  Weight: 72.5 kg (159 lb 12.8 oz)     Height: 4\' 10"  (1.473 m)       Intake/Output Summary (Last 24 hours) at 04/20/17 1147 Last data filed at 04/20/17 1033  Gross per 24 hour  Intake              260 ml  Output              200 ml  Net               60 ml   Weight change:  Exam:   General:  Pt is alert, follows commands appropriately, not in acute distress  HEENT: No icterus, No thrush, No neck mass, [place]/AT  Cardiovascular: RRR, S1/S2, no rubs, no gallops  Respiratory: CTA bilaterally, no wheezing, no crackles, no rhonchi  Abdomen: Soft/+BS, non tender, non distended, no guarding  Extremities: Left upper extremity in a bulky dressing; radial and ulnar pulses present   Data Reviewed: I have personally reviewed following labs and imaging studies Basic Metabolic Panel:  Recent Labs Lab 04/19/17 2134  NA 138  K 3.5  CL 103  CO2 24  GLUCOSE 92  BUN 13  CREATININE 0.95  CALCIUM 8.8*   Liver Function Tests: No results for input(s): AST, ALT, ALKPHOS, BILITOT, PROT, ALBUMIN in the last 168 hours. No results for input(s): LIPASE, AMYLASE in the last 168 hours. No results for input(s): AMMONIA in the last 168 hours. Coagulation Profile: No results for input(s): INR, PROTIME  in the last 168 hours. CBC:  Recent Labs Lab 04/19/17 2134  WBC 11.9*  NEUTROABS 6.7  HGB 14.2  HCT 43.4  MCV 95.0  PLT 265   Cardiac Enzymes: No results for input(s): CKTOTAL, CKMB, CKMBINDEX, TROPONINI in the last 168 hours. BNP: Invalid input(s): POCBNP CBG: No results for input(s): GLUCAP in the last 168 hours. HbA1C: No results for input(s): HGBA1C in the last 72 hours. Urine analysis:    Component Value Date/Time   COLORURINE YELLOW 05/05/2015 1600   APPEARANCEUR CLEAR 05/05/2015 1600   LABSPEC >1.030 (H) 05/05/2015 1600   PHURINE 5.5 05/05/2015 1600   GLUCOSEU NEGATIVE 05/05/2015 1600   HGBUR TRACE (A)  05/05/2015 1600   BILIRUBINUR NEGATIVE 05/05/2015 1600   KETONESUR NEGATIVE 05/05/2015 1600   PROTEINUR NEGATIVE 05/05/2015 1600   UROBILINOGEN 0.2 05/05/2015 1600   NITRITE NEGATIVE 05/05/2015 1600   LEUKOCYTESUR NEGATIVE 05/05/2015 1600   Sepsis Labs: @LABRCNTIP (procalcitonin:4,lacticidven:4) ) Recent Results (from the past 240 hour(s))  Surgical PCR screen     Status: None   Collection Time: 04/20/17  8:09 AM  Result Value Ref Range Status   MRSA, PCR NEGATIVE NEGATIVE Final   Staphylococcus aureus NEGATIVE NEGATIVE Final    Comment: (NOTE) The Xpert SA Assay (FDA approved for NASAL specimens in patients 73 years of age and older), is one component of a comprehensive surveillance program. It is not intended to diagnose infection nor to guide or monitor treatment.      Scheduled Meds: . [MAR Hold] pantoprazole  40 mg Oral Daily  . povidone-iodine  2 application Topical Once   Continuous Infusions: . [MAR Hold] ampicillin-sulbactam (UNASYN) IV Stopped (04/20/17 1113)    Procedures/Studies: Dg Finger Thumb Left  Result Date: 04/19/2017 CLINICAL DATA:  44 y/o F; dog bite to the middle left thumb, initial encounter. EXAM: LEFT THUMB 2+V COMPARISON:  None. FINDINGS: There is no evidence of fracture or dislocation. There is no evidence of arthropathy or other focal bone abnormality. IMPRESSION: No acute fracture or dislocation. Electronically Signed   By: 20 M.D.   On: 04/19/2017 22:15    Malkie Wille, DO  [facility] Pager [phone]  If 7PM-7AM, please contact night-coverage [email or web address] Password [email or web address] 04/20/2017, 11:47 AM   LOS: 1 day

## 2017-04-20 NOTE — Anesthesia Procedure Notes (Signed)
Procedure Name: LMA Insertion Date/Time: 04/20/2017 12:34 PM Performed by: Pernell Dupre, Jalysa Swopes A Pre-anesthesia Checklist: Patient identified, Timeout performed, Emergency Drugs available, Suction available and Patient being monitored Patient Re-evaluated:Patient Re-evaluated prior to induction Preoxygenation: Pre-oxygenation with 100% oxygen Induction Type: IV induction Ventilation: Mask ventilation without difficulty LMA Size: 3.0 Number of attempts: 1 Placement Confirmation: positive ETCO2 and breath sounds checked- equal and bilateral Tube secured with: Tape Dental Injury: Teeth and Oropharynx as per pre-operative assessment

## 2017-04-20 NOTE — Consult Note (Signed)
Reason for Consult: left thumb cellulitis Referring Physician: Dr. Cook  Cathy [name] is an 36 y.o. female.  HPI: 36-year-old female was bitten by dog on September 25. She says they were Jack Russell terriers; she says they were up-to-date  up-to-date on all the shots. She had not had a tetanus shot so she had tetanus toxoid in the ER. She complains of severe dull aching pain in the left thumb first extensor compartment and volar aspect of the left thumb there is a puncture wound over the interphalangeal joint of the left thumb as well as the thumb pad volarly. There is streaking down to into the first extensor compartment with pain into the forearm.She did complain of fever. She says her pain is severe although its relief with Percocet and morphine.  History that is in the record already: Cathy [name] is a 36 y.o. female with medical history significant of chronic knee pain, GERD, heart murmur of newborn, rheumatoid arthritis, IBS who was coming to the emergency department with complaining of being bitten by a dog while she was trying to intervene in a fight with another canine yesterday. Per patient, the dog is domestic and is up-to-date with vaccinations, including rabies. She mentions having fever last night and today along with night sweats. She complains that her left thumb has become painful, erythematous and edematous. She is unable to flex her left thumb without pain and difficulty. Her pain is currently at 4 out of 10 after 2 rounds of morphine 4 mg IVP. She denies headache, sore throat, dyspnea, chest pain, palpitations, dizziness, lower extremity edema, abdominal pain, nausea, emesis, melena or hematochezia. She mentions having a history of IBS for more than 20 years and occasionally gets diarrhea or constipation. She denies dysuria, frequency, hematuria, polyuria, polydipsia, polyphagia or blurred vision. She complains of frequent small joint swelling due to her RA, but is not currently  taking any disease modifying agents, any other prescriptions or over-the-counter medications, since she does not have a primary care provider at this time.   ED Course: Initial vital signs in emergency department temperature 37C, pulse 85, respirations 18, blood pressure 138/93 mmHg and O2 sat 100% on room air. She received clindamycin and ciprofloxacin IVPB in the emergency department, but this regimen was discontinued after the patient developed itching while the fluoroquinolone was infusing. Subsequently, Unasyn was started. She received 2 rounds of morphine 4 mg IVP. I ordered Toradol 30 mg IVP 1 in the emergency department.    Her workup shows WBC of 11.9 with 56% neutrophils, hemoglobin 14.2 g/dL and platelets 265. Her BMP showed a mildly low calcium of 8.8 mg/dL, but all other values were normal. Left hand x-ray did not show any fracture or dislocation.    Hand surgery was consulted by Dr. Cook and recommended admission, IV antibiotics. They will evaluate tomorrow.   Review of Systems: As per HPI otherwise 10 point review of systems negative.    Past Medical History:  Diagnosis Date  . Chronic knee pain   . GERD (gastroesophageal reflux disease)   . Heart murmur of newborn   . RA (rheumatoid arthritis) (HCC)     Past Surgical History:  Procedure Laterality Date  . ABDOMINAL HYSTERECTOMY     total  . CHOLECYSTECTOMY    . FOOT SURGERY    . TUBAL LIGATION      Family History  Problem Relation Age of Onset  . Alzheimer's disease Mother   . Heart disease Father   . Arthritis   Father        rheumatoid  . Arthritis Sister        rheumatoid  . CAD Sister        "Died of massive Heart attack at age 50"    Social History:  reports that she has been smoking Cigarettes.  She has been smoking about 0.50 packs per day. She has never used smokeless tobacco. She reports that she does not drink alcohol or use drugs.  Allergies:  Allergies  Allergen Reactions  . Ciprofloxacin  Itching     Current Facility-Administered Medications:  .  Ampicillin-Sulbactam (UNASYN) 3 g in sodium chloride 0.9 % 100 mL IVPB, 3 g, Intravenous, Q6H, Ortiz, David Manuel, MD, Stopped at 04/20/17 0528 .  HYDROmorphone (DILAUDID) injection 0.5-1 mg, 0.5-1 mg, Intravenous, Q2H PRN, Harrison, Stanley E, MD .  ketorolac (TORADOL) 30 MG/ML injection 30 mg, 30 mg, Intravenous, Q6H PRN, Ortiz, David Manuel, MD .  nicotine (NICODERM CQ - dosed in mg/24 hours) patch 21 mg, 21 mg, Transdermal, Daily PRN, Ortiz, David Manuel, MD .  oxyCODONE-acetaminophen (PERCOCET/ROXICET) 5-325 MG per tablet 1 tablet, 1 tablet, Oral, Q4H PRN, Ortiz, David Manuel, MD, 1 tablet at 04/20/17 0505 .  pantoprazole (PROTONIX) EC tablet 40 mg, 40 mg, Oral, Daily, Ortiz, David Manuel, MD  Results for orders placed or performed during the hospital encounter of 04/19/17 (from the past 48 hour(s))  CBC with Differential     Status: Abnormal   Collection Time: 04/19/17  9:34 PM  Result Value Ref Range   WBC 11.9 (H) 4.0 - 10.5 K/uL   RBC 4.57 3.87 - 5.11 MIL/uL   Hemoglobin 14.2 12.0 - 15.0 g/dL   HCT 43.4 36.0 - 46.0 %   MCV 95.0 78.0 - 100.0 fL   MCH 31.1 26.0 - 34.0 pg   MCHC 32.7 30.0 - 36.0 g/dL   RDW 13.7 11.5 - 15.5 %   Platelets 265 150 - 400 K/uL   Neutrophils Relative % 56 %   Neutro Abs 6.7 1.7 - 7.7 K/uL   Lymphocytes Relative 31 %   Lymphs Abs 3.7 0.7 - 4.0 K/uL   Monocytes Relative 10 %   Monocytes Absolute 1.2 (H) 0.1 - 1.0 K/uL   Eosinophils Relative 3 %   Eosinophils Absolute 0.3 0.0 - 0.7 K/uL   Basophils Relative 0 %   Basophils Absolute 0.0 0.0 - 0.1 K/uL  Basic metabolic panel     Status: Abnormal   Collection Time: 04/19/17  9:34 PM  Result Value Ref Range   Sodium 138 135 - 145 mmol/L   Potassium 3.5 3.5 - 5.1 mmol/L   Chloride 103 101 - 111 mmol/L   CO2 24 22 - 32 mmol/L   Glucose, Bld 92 65 - 99 mg/dL   BUN 13 6 - 20 mg/dL   Creatinine, Ser 0.95 0.44 - 1.00 mg/dL   Calcium 8.8 (L)  8.9 - 10.3 mg/dL   GFR calc non Af Amer >60 >60 mL/min   GFR calc Af Amer >60 >60 mL/min    Comment: (NOTE) The eGFR has been calculated using the CKD EPI equation. This calculation has not been validated in all clinical situations. eGFR's persistently <60 mL/min signify possible Chronic Kidney Disease.    Anion gap 11 5 - 15    Dg Finger Thumb Left  Result Date: 04/19/2017 CLINICAL DATA:  36 y/o F; dog bite to the middle left thumb, initial encounter. EXAM: LEFT THUMB 2+V COMPARISON:  None. FINDINGS:   There is no evidence of fracture or dislocation. There is no evidence of arthropathy or other focal bone abnormality. IMPRESSION: No acute fracture or dislocation. Electronically Signed   By: Lance  Furusawa-Stratton M.D.   On: 04/19/2017 22:15    Review of Systems  Reason unable to perform ROS: See hpi section.   Blood pressure (!) 95/55, pulse (!) 56, temperature 98.3 F (36.8 C), temperature source Oral, resp. rate 18, height 4' 10" (1.473 m), weight 159 lb 12.8 oz (72.5 kg), SpO2 94 %. Physical Exam  Constitutional: She is oriented to person, place, and time. She appears well-nourished.  Eyes: Right eye exhibits no discharge. Left eye exhibits no discharge. No scleral icterus.  Neck: Neck supple. No JVD present. No tracheal deviation present.  Cardiovascular: Normal rate and intact distal pulses.   Respiratory: Effort normal. No stridor.  GI: Soft. She exhibits no distension.  Neurological: She is alert and oriented to person, place, and time. She has normal reflexes. She exhibits normal muscle tone. Coordination normal.  Psychiatric: She has a normal mood and affect. Her behavior is normal. Thought content normal.   Right upper extremity inspection reveals normal alignment, normal range of motion stability and strength of all joints normal neurovascular exam normal skin  Left upper extremity puncture wound on the volar aspect of the thumb and over the interphalangeal joint left  thumb. Streaking is in the first extensor compartment with tenderness in the compartment of the mid level of the forearm. There is erythema associated with both puncture wounds. The patient's flexion extension at the IP joint is painful though intact. There is no pain or tenderness on the thenar eminence or the remaining portion of the hand. The shoulder elbow and wrist range of motion is normal. Muscle tone is normal throughout the upper extremity no joint subluxations are observed. Alignment is normal in the extremity. Skin as described, She has no sensory loss and normal pulse perfusion and color to the digits.  Assessment/Plan: Plain films revealed no evidence of fracture  Impression First extensor compartment tenosynovitis infectious,fell on, possible septic arthritis.  Recommend incision and drainage of the first extensor compartment, incision and drainage of the felon. Open irrigation debridement of the interphalangeal joint left thumb  The procedure has been fully reviewed with the patient; The risks and benefits of surgery have been discussed and explained and understood. Alternative treatment has also been reviewed, questions were encouraged and answered. The postoperative plan is also been reviewed.   Stanley Harrison 04/20/2017, 7:37 AM     

## 2017-04-20 NOTE — H&P (View-Only) (Signed)
Reason for Consult: left thumb cellulitis Referring Physician: Dr. Cook  Cathy [name] is an 36 y.o. female.  HPI: 36-year-old female was bitten by dog on September 25. She says they were Jack Russell terriers; she says they were up-to-date  up-to-date on all the shots. She had not had a tetanus shot so she had tetanus toxoid in the ER. She complains of severe dull aching pain in the left thumb first extensor compartment and volar aspect of the left thumb there is a puncture wound over the interphalangeal joint of the left thumb as well as the thumb pad volarly. There is streaking down to into the first extensor compartment with pain into the forearm.She did complain of fever. She says her pain is severe although its relief with Percocet and morphine.  History that is in the record already: Cathy [name] is a 36 y.o. female with medical history significant of chronic knee pain, GERD, heart murmur of newborn, rheumatoid arthritis, IBS who was coming to the emergency department with complaining of being bitten by a dog while she was trying to intervene in a fight with another canine yesterday. Per patient, the dog is domestic and is up-to-date with vaccinations, including rabies. She mentions having fever last night and today along with night sweats. She complains that her left thumb has become painful, erythematous and edematous. She is unable to flex her left thumb without pain and difficulty. Her pain is currently at 4 out of 10 after 2 rounds of morphine 4 mg IVP. She denies headache, sore throat, dyspnea, chest pain, palpitations, dizziness, lower extremity edema, abdominal pain, nausea, emesis, melena or hematochezia. She mentions having a history of IBS for more than 20 years and occasionally gets diarrhea or constipation. She denies dysuria, frequency, hematuria, polyuria, polydipsia, polyphagia or blurred vision. She complains of frequent small joint swelling due to her RA, but is not currently  taking any disease modifying agents, any other prescriptions or over-the-counter medications, since she does not have a primary care provider at this time.   ED Course: Initial vital signs in emergency department temperature 37C, pulse 85, respirations 18, blood pressure 138/93 mmHg and O2 sat 100% on room air. She received clindamycin and ciprofloxacin IVPB in the emergency department, but this regimen was discontinued after the patient developed itching while the fluoroquinolone was infusing. Subsequently, Unasyn was started. She received 2 rounds of morphine 4 mg IVP. I ordered Toradol 30 mg IVP 1 in the emergency department.    Her workup shows WBC of 11.9 with 56% neutrophils, hemoglobin 14.2 g/dL and platelets 265. Her BMP showed a mildly low calcium of 8.8 mg/dL, but all other values were normal. Left hand x-ray did not show any fracture or dislocation.    Hand surgery was consulted by Dr. Cook and recommended admission, IV antibiotics. They will evaluate tomorrow.   Review of Systems: As per HPI otherwise 10 point review of systems negative.    Past Medical History:  Diagnosis Date  . Chronic knee pain   . GERD (gastroesophageal reflux disease)   . Heart murmur of newborn   . RA (rheumatoid arthritis) (HCC)     Past Surgical History:  Procedure Laterality Date  . ABDOMINAL HYSTERECTOMY     total  . CHOLECYSTECTOMY    . FOOT SURGERY    . TUBAL LIGATION      Family History  Problem Relation Age of Onset  . Alzheimer's disease Mother   . Heart disease Father   . Arthritis   Father        rheumatoid  . Arthritis Sister        rheumatoid  . CAD Sister        "Died of massive Heart attack at age 50"    Social History:  reports that she has been smoking Cigarettes.  She has been smoking about 0.50 packs per day. She has never used smokeless tobacco. She reports that she does not drink alcohol or use drugs.  Allergies:  Allergies  Allergen Reactions  . Ciprofloxacin  Itching     Current Facility-Administered Medications:  .  Ampicillin-Sulbactam (UNASYN) 3 g in sodium chloride 0.9 % 100 mL IVPB, 3 g, Intravenous, Q6H, Ortiz, David Manuel, MD, Stopped at 04/20/17 0528 .  HYDROmorphone (DILAUDID) injection 0.5-1 mg, 0.5-1 mg, Intravenous, Q2H PRN, Harrison, Stanley E, MD .  ketorolac (TORADOL) 30 MG/ML injection 30 mg, 30 mg, Intravenous, Q6H PRN, Ortiz, David Manuel, MD .  nicotine (NICODERM CQ - dosed in mg/24 hours) patch 21 mg, 21 mg, Transdermal, Daily PRN, Ortiz, David Manuel, MD .  oxyCODONE-acetaminophen (PERCOCET/ROXICET) 5-325 MG per tablet 1 tablet, 1 tablet, Oral, Q4H PRN, Ortiz, David Manuel, MD, 1 tablet at 04/20/17 0505 .  pantoprazole (PROTONIX) EC tablet 40 mg, 40 mg, Oral, Daily, Ortiz, David Manuel, MD  Results for orders placed or performed during the hospital encounter of 04/19/17 (from the past 48 hour(s))  CBC with Differential     Status: Abnormal   Collection Time: 04/19/17  9:34 PM  Result Value Ref Range   WBC 11.9 (H) 4.0 - 10.5 K/uL   RBC 4.57 3.87 - 5.11 MIL/uL   Hemoglobin 14.2 12.0 - 15.0 g/dL   HCT 43.4 36.0 - 46.0 %   MCV 95.0 78.0 - 100.0 fL   MCH 31.1 26.0 - 34.0 pg   MCHC 32.7 30.0 - 36.0 g/dL   RDW 13.7 11.5 - 15.5 %   Platelets 265 150 - 400 K/uL   Neutrophils Relative % 56 %   Neutro Abs 6.7 1.7 - 7.7 K/uL   Lymphocytes Relative 31 %   Lymphs Abs 3.7 0.7 - 4.0 K/uL   Monocytes Relative 10 %   Monocytes Absolute 1.2 (H) 0.1 - 1.0 K/uL   Eosinophils Relative 3 %   Eosinophils Absolute 0.3 0.0 - 0.7 K/uL   Basophils Relative 0 %   Basophils Absolute 0.0 0.0 - 0.1 K/uL  Basic metabolic panel     Status: Abnormal   Collection Time: 04/19/17  9:34 PM  Result Value Ref Range   Sodium 138 135 - 145 mmol/L   Potassium 3.5 3.5 - 5.1 mmol/L   Chloride 103 101 - 111 mmol/L   CO2 24 22 - 32 mmol/L   Glucose, Bld 92 65 - 99 mg/dL   BUN 13 6 - 20 mg/dL   Creatinine, Ser 0.95 0.44 - 1.00 mg/dL   Calcium 8.8 (L)  8.9 - 10.3 mg/dL   GFR calc non Af Amer >60 >60 mL/min   GFR calc Af Amer >60 >60 mL/min    Comment: (NOTE) The eGFR has been calculated using the CKD EPI equation. This calculation has not been validated in all clinical situations. eGFR's persistently <60 mL/min signify possible Chronic Kidney Disease.    Anion gap 11 5 - 15    Dg Finger Thumb Left  Result Date: 04/19/2017 CLINICAL DATA:  36 y/o F; dog bite to the middle left thumb, initial encounter. EXAM: LEFT THUMB 2+V COMPARISON:  None. FINDINGS:   There is no evidence of fracture or dislocation. There is no evidence of arthropathy or other focal bone abnormality. IMPRESSION: No acute fracture or dislocation. Electronically Signed   By: Kristine Garbe M.D.   On: 04/19/2017 22:15    Review of Systems  Reason unable to perform ROS: See hpi section.   Blood pressure (!) 95/55, pulse (!) 56, temperature 98.3 F (36.8 C), temperature source Oral, resp. rate 18, height 4' 10" (1.473 m), weight 159 lb 12.8 oz (72.5 kg), SpO2 94 %. Physical Exam  Constitutional: She is oriented to person, place, and time. She appears well-nourished.  Eyes: Right eye exhibits no discharge. Left eye exhibits no discharge. No scleral icterus.  Neck: Neck supple. No JVD present. No tracheal deviation present.  Cardiovascular: Normal rate and intact distal pulses.   Respiratory: Effort normal. No stridor.  GI: Soft. She exhibits no distension.  Neurological: She is alert and oriented to person, place, and time. She has normal reflexes. She exhibits normal muscle tone. Coordination normal.  Psychiatric: She has a normal mood and affect. Her behavior is normal. Thought content normal.   Right upper extremity inspection reveals normal alignment, normal range of motion stability and strength of all joints normal neurovascular exam normal skin  Left upper extremity puncture wound on the volar aspect of the thumb and over the interphalangeal joint left  thumb. Streaking is in the first extensor compartment with tenderness in the compartment of the mid level of the forearm. There is erythema associated with both puncture wounds. The patient's flexion extension at the IP joint is painful though intact. There is no pain or tenderness on the thenar eminence or the remaining portion of the hand. The shoulder elbow and wrist range of motion is normal. Muscle tone is normal throughout the upper extremity no joint subluxations are observed. Alignment is normal in the extremity. Skin as described, She has no sensory loss and normal pulse perfusion and color to the digits.  Assessment/Plan: Plain films revealed no evidence of fracture  Impression First extensor compartment tenosynovitis infectious,fell on, possible septic arthritis.  Recommend incision and drainage of the first extensor compartment, incision and drainage of the felon. Open irrigation debridement of the interphalangeal joint left thumb  The procedure has been fully reviewed with the patient; The risks and benefits of surgery have been discussed and explained and understood. Alternative treatment has also been reviewed, questions were encouraged and answered. The postoperative plan is also been reviewed.   Arther Abbott 04/20/2017, 7:37 AM

## 2017-04-20 NOTE — Brief Op Note (Addendum)
04/19/2017 - 04/20/2017  1:06 PM  PATIENT:  Cathy [name]  36 y.o. female  PRE-OPERATIVE DIAGNOSIS:  TENOSYNOVITIS AND FELON LEFT THUMB  POST-OPERATIVE DIAGNOSIS:  Cellulitis with abscess left thumb, fell on left thumb  PROCEDURE:  Procedure(s) with comments: INCISION AND DRAINAGE LEFT THUMB (Left) - CULTURE ANEROBIC AEROBIC  Operative findings the volar wound was a felon with purulence  The dorsal wound did not go into the joint and was a cellulitis. It did not appear to go into the first extensor compartment  Surgery was done as follows  Site marking was performed in the preop area the patient was taken to surgery general anesthesia was administered she was prepped with Betadine secondary to the 2 lacerations  After timeout a made a longitudinal incision over the left thumb to get down to the midportion of the proximal phalanx and down to the nail fold. Material was noted and cultured. I inspected the extensor tendon there was no evidence of breach of the extensor tendon.  I then made a separate incision after changing gloves over the felon and drained pus from that area. I did blunt dissection and the volar portion of the thumb pad and washed both wounds t separately  Partial closure of both wounds was performed with packing in the thumb over the DIP joint.  Sterile dressing applied  24 hours of antibiotics in the patient should be able to go home tomorrow on oral Augmentin   SURGEON:  Surgeon(s) and Role:    * Vickki Hearing, MD - Primary  PHYSICIAN ASSISTANT:   ASSISTANTS: none   ANESTHESIA:   general  EBL:  Total I/O In: 700 [I.V.:700] Out: 205 [Urine:200; Blood:5]  BLOOD ADMINISTERED:none  DRAINS: none   LOCAL MEDICATIONS USED:  NONE  SPECIMEN:  No Specimen  DISPOSITION OF SPECIMEN:  N/A  COUNTS:  YES  TOURNIQUET:    DICTATION: .Dragon Dictation  PLAN OF CARE: Admit to inpatient   PATIENT DISPOSITION:  PACU - hemodynamically stable.   Delay  start of Pharmacological VTE agent (>24hrs) due to surgical blood loss or risk of bleeding: not applicable

## 2017-04-20 NOTE — Anesthesia Preprocedure Evaluation (Signed)
Anesthesia Evaluation  Patient identified by MRN, date of birth, ID band Patient awake    Reviewed: Allergy & Precautions, NPO status , Patient's Chart, lab work & pertinent test results  Airway Mallampati: II  TM Distance: >3 FB Neck ROM: Full    Dental  (+) Poor Dentition, Missing, Chipped   Pulmonary Current Smoker,    breath sounds clear to auscultation       Cardiovascular negative cardio ROS   Rhythm:Regular Rate:Normal     Neuro/Psych negative neurological ROS  negative psych ROS   GI/Hepatic GERD  Medicated and Controlled,  Endo/Other    Renal/GU      Musculoskeletal  (+) Arthritis ,   Abdominal   Peds  Hematology   Anesthesia Other Findings   Reproductive/Obstetrics                             Anesthesia Physical Anesthesia Plan  ASA: II  Anesthesia Plan: General   Post-op Pain Management:    Induction: Intravenous  PONV Risk Score and Plan:   Airway Management Planned: LMA  Additional Equipment:   Intra-op Plan:   Post-operative Plan: Extubation in OR  Informed Consent: I have reviewed the patients History and Physical, chart, labs and discussed the procedure including the risks, benefits and alternatives for the proposed anesthesia with the patient or authorized representative who has indicated his/her understanding and acceptance.     Plan Discussed with:   Anesthesia Plan Comments:         Anesthesia Quick Evaluation

## 2017-04-20 NOTE — Op Note (Signed)
04/20/2017  1:06 PM  PATIENT:  Cathy [name]  36 y.o. female  PRE-OPERATIVE DIAGNOSIS:  TENOSYNOVITIS AND FELON LEFT THUMB  POST-OPERATIVE DIAGNOSIS:  Cellulitis with abscess left thumb, fell on left thumb  PROCEDURE:  Procedure(s) with comments: INCISION AND DRAINAGE LEFT THUMB (Left) - CULTURE ANEROBIC AEROBIC  Operative findings the volar wound was a felon with purulence  The dorsal wound did not go into the joint and was a cellulitis. It did not appear to go into the first extensor compartment  Surgery was done as follows  Site marking was performed in the preop area the patient was taken to surgery general anesthesia was administered she was prepped with Betadine secondary to the 2 lacerations  After timeout a made a longitudinal incision over the left thumb to get down to the midportion of the proximal phalanx and down to the nail fold. Material was noted and cultured. I inspected the extensor tendon there was no evidence of breach of the extensor tendon.  I then made a separate incision after changing gloves over the felon and drained pus from that area. I did blunt dissection and the volar portion of the thumb pad and washed both wounds t separately  Partial closure of both wounds was performed with packing in the thumb over the DIP joint.  Sterile dressing applied  24 hours of antibiotics in the patient should be able to go home tomorrow on oral Augmentin   SURGEON:  Surgeon(s) and Role:    * Vickki Hearing, MD - Primary  PHYSICIAN ASSISTANT:   ASSISTANTS: none   ANESTHESIA:   general  EBL:  Total I/O In: 700 [I.V.:700] Out: 205 [Urine:200; Blood:5]  BLOOD ADMINISTERED:none  DRAINS: none   LOCAL MEDICATIONS USED:  NONE  SPECIMEN:  No Specimen  DISPOSITION OF SPECIMEN:  N/A  COUNTS:  YES  TOURNIQUET:    DICTATION: .Dragon Dictation  PLAN OF CARE: Admit to inpatient   PATIENT DISPOSITION:  PACU - hemodynamically stable.   Delay start of  Pharmacological VTE agent (>24hrs) due to surgical blood loss or risk of bleeding: not applicable

## 2017-04-20 NOTE — Interval H&P Note (Signed)
History and Physical Interval Note:  04/20/2017 11:33 AM  Cathy [name]  has presented today for surgery, with the diagnosis of TENOSYNOVITIS AND FELON LEFT THUMB  The various methods of treatment have been discussed with the patient and family. After consideration of risks, benefits and other options for treatment, the patient has consented to  Procedure(s) with comments: INCISION AND DRAINAGE LEFT THUMB (Left) - CULTURE ANEROBIC AEROBIC as a surgical intervention .  The patient's history has been reviewed, patient examined, no change in status, stable for surgery.  I have reviewed the patient's chart and labs.  Questions were answered to the patient's satisfaction.     Fuller Canada

## 2017-04-20 NOTE — Care Management Note (Signed)
Case Management Note  Patient Details  Name: Cathy [name] MRN: [id number] Date of Birth: [birth year]  Subjective/Objective:                  Admitted with cellulitls to hand from dog bite. S/p I&D. Pt from home, ind, has transportation, no insurance, no PCP, unemployed.   Action/Plan: FC aware of uninsured status, pt will get FA application in mail after DC, pt aware. Plan for DC tomorrow. Will f/u with surgeon. CM provided pt with info on PCP/medical care options in [facility] for the uninsured. CM offered to make f/u appointment with [facility] as pt has been there before, pt not interested at this time. CM has taken pt MATCH voucher, pt veralizes understanding. Family at bedside for DC plan discussion. All documents placed on pt's chart sot that they may be given to pt with AVS at time of DC.     Expected Discharge Date:  04/21/17               Expected Discharge Plan:  Home/Self Care  In-House Referral:  NA  Discharge planning Services  CM Consult, [facility]  Post Acute Care Choice:  NA Choice offered to:  NA  Status of Service:  Completed, signed off  Malcolm Metro, RN 04/20/2017, 3:07 PM

## 2017-04-20 NOTE — Interval H&P Note (Signed)
History and Physical Interval Note:  04/20/2017 11:33 AM  Cathy [name]  has presented today for surgery, with the diagnosis of TENOSYNOVITIS AND FELON LEFT THUMB  The various methods of treatment have been discussed with the patient and family. After consideration of risks, benefits and other options for treatment, the patient has consented to  Procedure(s) with comments: INCISION AND DRAINAGE LEFT THUMB (Left) - CULTURE ANEROBIC AEROBIC as a surgical intervention .  The patient's history has been reviewed, patient examined, no change in status, stable for surgery.  I have reviewed the patient's chart and labs.  Questions were answered to the patient's satisfaction.     Ranee Peasley   

## 2017-04-20 NOTE — H&P (Signed)
History and Physical    Cathy [name] QQV:[id number] DOB: 13-Jul-1981 DOA: 04/19/2017  PCP: Patient, No Pcp Per   Patient coming from: Home.  I have personally briefly reviewed patient's old medical records in [facility]  Chief Complaint: Left hand dog bite.  HPI: Cathy [name] is a 36 y.o. female with medical history significant of chronic knee pain, GERD, heart murmur of newborn, rheumatoid arthritis, IBS who was coming to the emergency department with complaining of being bitten by a dog while she was trying to intervene in a fight with another canine yesterday. Per patient, the dog is domestic and is up-to-date with vaccinations, including rabies. She mentions having fever last night and today along with night sweats. She complains that her left thumb has become painful, erythematous and edematous. She is unable to flex her left thumb without pain and difficulty. Her pain is currently at 4 out of 10 after 2 rounds of morphine 4 mg IVP. She denies headache, sore throat, dyspnea, chest pain, palpitations, dizziness, lower extremity edema, abdominal pain, nausea, emesis, melena or hematochezia. She mentions having a history of IBS for more than 20 years and occasionally gets diarrhea or constipation. She denies dysuria, frequency, hematuria, polyuria, polydipsia, polyphagia or blurred vision. She complains of frequent small joint swelling due to her RA, but is not currently taking any disease modifying agents, any other prescriptions or over-the-counter medications, since she does not have a primary care provider at this time.  ED Course: Initial vital signs in emergency department temperature 37C, pulse 85, respirations 18, blood pressure 138/93 mmHg and O2 sat 100% on room air. She received clindamycin and ciprofloxacin IVPB in the emergency department, but this regimen was discontinued after the patient developed itching while the fluoroquinolone was infusing. Subsequently, Unasyn was  started. She received 2 rounds of morphine 4 mg IVP. I ordered Toradol 30 mg IVP 1 in the emergency department.   Her workup shows WBC of 11.9 with 56% neutrophils, hemoglobin 14.2 g/dL and platelets 332. Her BMP showed a mildly low calcium of 8.8 mg/dL, but all other values were normal. Left hand x-ray did not show any fracture or dislocation.   Hand surgery was consulted by Dr. Adriana Simas and recommended admission, IV antibiotics. They will evaluate tomorrow.  Review of Systems: As per HPI otherwise 10 point review of systems negative.    Past Medical History:  Diagnosis Date  . Chronic knee pain   . GERD (gastroesophageal reflux disease)   . Heart murmur of newborn   . RA (rheumatoid arthritis) (HCC)     Past Surgical History:  Procedure Laterality Date  . ABDOMINAL HYSTERECTOMY     total  . CHOLECYSTECTOMY    . FOOT SURGERY    . TUBAL LIGATION       reports that she has been smoking Cigarettes.  She has been smoking about 0.50 packs per day. She has never used smokeless tobacco. She reports that she does not drink alcohol or use drugs.  Allergies  Allergen Reactions  . Ciprofloxacin Itching    Family History  Problem Relation Age of Onset  . Alzheimer's disease Mother   . Heart disease Father   . Arthritis Father        rheumatoid  . Arthritis Sister        rheumatoid  . CAD Sister        "Died of massive Heart attack at age 27"    Prior to Admission medications   Not on File  Physical Exam: Vitals:   04/19/17 2042 04/19/17 2045 04/19/17 2317  BP:  (!) 138/93 119/84  Pulse:  85 74  Resp:  18 18  Temp:  98.6 F (37 C) 98.4 F (36.9 C)  TempSrc:   Oral  SpO2:  100% 96%  Weight: 72.6 kg (160 lb)    Height: 4\' 10"  (1.473 m)      Constitutional: NAD, calm, comfortable Eyes: PERRL, lids and conjunctivae normal ENMT: Mucous membranes are moist. Posterior pharynx clear of any exudate or lesions. Neck: Normal, supple, no masses, no thyromegaly Respiratory:  clear to auscultation bilaterally, no wheezing, no crackles. Normal respiratory effort. No accessory muscle use.  Cardiovascular: Regular rate and rhythm, no murmurs / rubs / gallops. No extremity edema. 2+ pedal pulses. No carotid bruits.  Abdomen: Soft, no tenderness, no masses palpated. No hepatosplenomegaly. Bowel sounds positive.  Musculoskeletal: no clubbing / cyanosis. No significant joint deformity upper and lower extremities. Mildly decreased left hand and moderate decrease in left thumb ROM, no contractures. Normal muscle tone.  Skin: Positive erythema on left thumb area. Please see picture below. Neurologic: CN 2-12 grossly intact. Sensation intact, DTR normal. Strength 5/5 in all 4.  Psychiatric: Normal judgment and insight. Alert and oriented x 4. Normal mood.       Labs on Admission: I have personally reviewed following labs and imaging studies  CBC:  Recent Labs Lab 04/19/17 2134  WBC 11.9*  NEUTROABS 6.7  HGB 14.2  HCT 43.4  MCV 95.0  PLT 265   Basic Metabolic Panel:  Recent Labs Lab 04/19/17 2134  NA 138  K 3.5  CL 103  CO2 24  GLUCOSE 92  BUN 13  CREATININE 0.95  CALCIUM 8.8*   GFR: Estimated Creatinine Clearance: 67.9 mL/min (by C-G formula based on SCr of 0.95 mg/dL). Liver Function Tests: No results for input(s): AST, ALT, ALKPHOS, BILITOT, PROT, ALBUMIN in the last 168 hours. No results for input(s): LIPASE, AMYLASE in the last 168 hours. No results for input(s): AMMONIA in the last 168 hours. Coagulation Profile: No results for input(s): INR, PROTIME in the last 168 hours. Cardiac Enzymes: No results for input(s): CKTOTAL, CKMB, CKMBINDEX, TROPONINI in the last 168 hours. BNP (last 3 results) No results for input(s): PROBNP in the last 8760 hours. HbA1C: No results for input(s): HGBA1C in the last 72 hours. CBG: No results for input(s): GLUCAP in the last 168 hours. Lipid Profile: No results for input(s): CHOL, HDL, LDLCALC, TRIG,  CHOLHDL, LDLDIRECT in the last 72 hours. Thyroid Function Tests: No results for input(s): TSH, T4TOTAL, FREET4, T3FREE, THYROIDAB in the last 72 hours. Anemia Panel: No results for input(s): VITAMINB12, FOLATE, FERRITIN, TIBC, IRON, RETICCTPCT in the last 72 hours. Urine analysis:    Component Value Date/Time   COLORURINE YELLOW 05/05/2015 1600   APPEARANCEUR CLEAR 05/05/2015 1600   LABSPEC >1.030 (H) 05/05/2015 1600   PHURINE 5.5 05/05/2015 1600   GLUCOSEU NEGATIVE 05/05/2015 1600   HGBUR TRACE (A) 05/05/2015 1600   BILIRUBINUR NEGATIVE 05/05/2015 1600   KETONESUR NEGATIVE 05/05/2015 1600   PROTEINUR NEGATIVE 05/05/2015 1600   UROBILINOGEN 0.2 05/05/2015 1600   NITRITE NEGATIVE 05/05/2015 1600   LEUKOCYTESUR NEGATIVE 05/05/2015 1600    Radiological Exams on Admission: Dg Finger Thumb Left  Result Date: 04/19/2017 CLINICAL DATA:  27 y/o F; dog bite to the middle left thumb, initial encounter. EXAM: LEFT THUMB 2+V COMPARISON:  None. FINDINGS: There is no evidence of fracture or dislocation. There is no evidence  of arthropathy or other focal bone abnormality. IMPRESSION: No acute fracture or dislocation. Electronically Signed   By: Mitzi Hansen M.D.   On: 04/19/2017 22:15    EKG: Independently reviewed.   Assessment/Plan Principal Problem:   Cellulitis of left hand Admit to MedSurg/inpatient. Continue Unasyn for 48-72 hours. Continue analgesics as needed. Antipyretics as needed. Hand surgery to evaluate in a.m.  Active Problems:   GERD (gastroesophageal reflux disease) Protonix 40 mg by mouth daily.    RA (rheumatoid arthritis) (HCC) Currently not on DMA. Per patient, oral NSAIDs upsets her stomach. Analgesics as needed.    Tobacco use Declined nicotine replacement therapy. NicoDerm CQ was order as needed, in case the patient changes her mind. Smoking cessation information will be provided.    DVT prophylaxis: Lovenox SQ. Code Status: Full  code. Family Communication:  Disposition Plan: Admit for IV antibiotics for 2-3 days and hand surgery evaluation in the morning Consults called: Hand surgery (Dr. Fuller Canada) Admission status: The patient/MedSurg.   Bobette Mo MD [facility] Pager [phone]  If 7PM-7AM, please contact night-coverage [email or web address] Password [email or web address]  04/20/2017, 12:02 AM

## 2017-04-21 ENCOUNTER — Encounter (HOSPITAL_COMMUNITY): Payer: Self-pay | Admitting: Orthopedic Surgery

## 2017-04-21 LAB — CBC
HCT: 40.3 % (ref 36.0–46.0)
Hemoglobin: 12.6 g/dL (ref 12.0–15.0)
MCH: 30.3 pg (ref 26.0–34.0)
MCHC: 31.3 g/dL (ref 30.0–36.0)
MCV: 96.9 fL (ref 78.0–100.0)
PLATELETS: 193 10*3/uL (ref 150–400)
RBC: 4.16 MIL/uL (ref 3.87–5.11)
RDW: 14 % (ref 11.5–15.5)
WBC: 7.2 10*3/uL (ref 4.0–10.5)

## 2017-04-21 LAB — MAGNESIUM: MAGNESIUM: 1.8 mg/dL (ref 1.7–2.4)

## 2017-04-21 MED ORDER — ENOXAPARIN SODIUM 40 MG/0.4ML ~~LOC~~ SOLN: 40.0000 mg | SUBCUTANEOUS | Status: DC

## 2017-04-21 MED ORDER — AMOXICILLIN-POT CLAVULANATE 875-125 MG PO TABS: 1.0000 | ORAL_TABLET | Freq: Two times a day (BID) | ORAL | 0 refills | Status: AC

## 2017-04-21 MED ORDER — OXYCODONE-ACETAMINOPHEN 5-325 MG PO TABS: 1.0000 | ORAL_TABLET | Freq: Four times a day (QID) | ORAL | 0 refills | Status: DC | PRN

## 2017-04-21 NOTE — Progress Notes (Signed)
Patient ID: Cathy [name], female   DOB: 09/03/1980, 36 y.o.   MRN: [id number]   Pod 1    Status post incision and drainage left thumb. I took her dressing down today. Her fingers are a little bit tight she did complain of some new pain over the left ulnar aspect and there was a bruise there I cannot quantify or qualify why. It is tender for her and she is complaining of pain there. She does have significant swelling of her fingers this should resolve now that the dressing has been changed and lucent  She can go home on oral antibiotics for 2 weeks and follow with me in one week

## 2017-04-21 NOTE — Discharge Summary (Signed)
Physician Discharge Summary  Cathy [name] WYO:[id number] DOB: Oct 29, 1980 DOA: 04/19/2017  PCP: Patient, No Pcp Per  Admit date: 04/19/2017 Discharge date: 04/21/2017  Admitted From: Home Disposition:  AGAINST MEDICAL ADVICE     Discharge Condition: AGAINST MEDICAL ADVICE CODE STATUS: full    Brief/Interim Summary: 36 year old female with a history of GERD, irritable bowel syndrome presented after being bitten by a dog on 04/18/2017 when she was trying to break up an altercation with another dog. The patient stated that all the animals were up-to-date with her immunizations including rabies. Over the next 24-48 hours, the patient complained of increasing pain, erythema, and edema of her left hand and thumb to the point where she was not able to flex or extend her thumb without difficulty. The patient has subjective fevers and chills. There was no nausea, vomiting, chest pain, short of breath, diarrhea, abdominal pain, dysuria, hematuria.  The patient was found to have WBC 11.9, but she was afebrile and hemodynamically stable. The patient was started on clindamycin and ciprofloxacin. Apparently, the patient had itching with Ciprofloxacin which was discontinued.  Pt subsequently started on Unasyn.  On the evening of 04/20/2017, the patient spiked a fever 101.40F. Blood cultures were obtained. On the morning of 04/21/2017, the patient still required intravenous hydromorphone for pain control. The patient wanted to leave against medical advice despite her fever the previous night. The risks including but not limited to worsening infection, sepsis, and death were discussed with the patient. The patient expressed understanding and still wanted to leave AGAINST MEDICAL ADVICE.  Discharge Diagnoses:  Cellulitis of left hand/Tenosynovitis/Abscess of left hand -concerned about septic arthritis of IP joint of thumb -continue Unasyn -appreciate Ortho -04/20/17--I&D--follow intraop cultures -Rx given--  Augmentin 9 more days -percocet 5/325 #18, 1-2 q 6 hrs prn pain  Tobacco abuse -cessation discussed  GERD -continue PPI   Discharge Instructions   Allergies as of 04/21/2017      Reactions   Ciprofloxacin Itching      Medication List    TAKE these medications   amoxicillin-clavulanate 875-125 MG tablet Commonly known as:  AUGMENTIN Take 1 tablet by mouth every 12 (twelve) hours.   oxyCODONE-acetaminophen 5-325 MG tablet Commonly known as:  PERCOCET/ROXICET Take 1-2 tablets by mouth every 6 (six) hours as needed for moderate pain.            Discharge Care Instructions        Start     Ordered   04/21/17 0000  oxyCODONE-acetaminophen (PERCOCET/ROXICET) 5-325 MG tablet  Every 6 hours PRN     04/21/17 1143   04/21/17 0000  amoxicillin-clavulanate (AUGMENTIN) 875-125 MG tablet  Every 12 hours     04/21/17 1143      Allergies  Allergen Reactions  . Ciprofloxacin Itching    Consultations:  ortho   Procedures/Studies: Dg Finger Thumb Left  Result Date: 04/19/2017 CLINICAL DATA:  26 y/o F; dog bite to the middle left thumb, initial encounter. EXAM: LEFT THUMB 2+V COMPARISON:  None. FINDINGS: There is no evidence of fracture or dislocation. There is no evidence of arthropathy or other focal bone abnormality. IMPRESSION: No acute fracture or dislocation. Electronically Signed   By: Mitzi Hansen M.D.   On: 04/19/2017 22:15         Discharge Exam: Vitals:   04/20/17 2110 04/21/17 0607  BP: (!) 91/48 100/61  Pulse: 99 88  Resp: 16 16  Temp: (!) 101.7 F (38.7 C) 98.9 F (37.2 C)  SpO2: 91%  93%   Vitals:   04/20/17 1610 04/20/17 1710 04/20/17 2110 04/21/17 0607  BP: (!) 97/58 101/62 (!) 91/48 100/61  Pulse: 67 71 99 88  Resp: 16 16 16 16   Temp: 97.6 F (36.4 C) 97.8 F (36.6 C) (!) 101.7 F (38.7 C) 98.9 F (37.2 C)  TempSrc:   Oral Oral  SpO2: 97% 98% 91% 93%  Weight:      Height:        General: Pt is alert, awake, not in  acute distress Cardiovascular: RRR, S1/S2 +, no rubs, no gallops Respiratory: diminished breath sounds at bases, no wheeze Abdominal: Soft, NT, ND, bowel sounds + Extremities: left hand in bulky dressing   The results of significant diagnostics from this hospitalization (including imaging, microbiology, ancillary and laboratory) are listed below for reference.    Significant Diagnostic Studies: Dg Finger Thumb Left  Result Date: 04/19/2017 CLINICAL DATA:  23 y/o F; dog bite to the middle left thumb, initial encounter. EXAM: LEFT THUMB 2+V COMPARISON:  None. FINDINGS: There is no evidence of fracture or dislocation. There is no evidence of arthropathy or other focal bone abnormality. IMPRESSION: No acute fracture or dislocation. Electronically Signed   By: 20 M.D.   On: 04/19/2017 22:15     Microbiology: Recent Results (from the past 240 hour(s))  Surgical PCR screen     Status: None   Collection Time: 04/20/17  8:09 AM  Result Value Ref Range Status   MRSA, PCR NEGATIVE NEGATIVE Final   Staphylococcus aureus NEGATIVE NEGATIVE Final    Comment: (NOTE) The Xpert SA Assay (FDA approved for NASAL specimens in patients 78 years of age and older), is one component of a comprehensive surveillance program. It is not intended to diagnose infection nor to guide or monitor treatment.   Aerobic/Anaerobic Culture (surgical/deep wound)     Status: None (Preliminary result)   Collection Time: 04/20/17 12:53 PM  Result Value Ref Range Status   Specimen Description WOUND THUMB LEFT  Final   Special Requests NONE  Final   Gram Stain   Final    ABUNDANT WBC PRESENT,BOTH PMN AND MONONUCLEAR NO ORGANISMS SEEN Performed at [facility], [address], [place], [place]    Culture PENDING  Incomplete   Report Status PENDING  Incomplete  Culture, blood (single)     Status: None (Preliminary result)   Collection Time: 04/20/17 11:21 PM  Result Value Ref Range  Status   Specimen Description RIGHT ANTECUBITAL  Final   Special Requests   Final    BOTTLES DRAWN AEROBIC AND ANAEROBIC Blood Culture adequate volume   Culture NO GROWTH < 12 HOURS  Final   Report Status PENDING  Incomplete     Labs: Basic Metabolic Panel:  Recent Labs Lab 04/19/17 2134 04/20/17 1408 04/21/17 0630  NA 138  --   --   K 3.5  --   --   CL 103  --   --   CO2 24  --   --   GLUCOSE 92  --   --   BUN 13  --   --   CREATININE 0.95 0.77  --   CALCIUM 8.8*  --   --   MG  --   --  1.8   Liver Function Tests: No results for input(s): AST, ALT, ALKPHOS, BILITOT, PROT, ALBUMIN in the last 168 hours. No results for input(s): LIPASE, AMYLASE in the last 168 hours. No results for input(s):  AMMONIA in the last 168 hours. CBC:  Recent Labs Lab 04/19/17 2134 04/20/17 1408 04/21/17 0630  WBC 11.9* 6.0 7.2  NEUTROABS 6.7  --   --   HGB 14.2 12.7 12.6  HCT 43.4 40.1 40.3  MCV 95.0 96.4 96.9  PLT 265 192 193   Cardiac Enzymes: No results for input(s): CKTOTAL, CKMB, CKMBINDEX, TROPONINI in the last 168 hours. BNP: Invalid input(s): POCBNP CBG: No results for input(s): GLUCAP in the last 168 hours.  Time coordinating discharge:  Greater than 30 minutes  Signed:  Eevee Borbon, DO [facility] Pager: [phone] 04/21/2017, 11:53 AM

## 2017-04-21 NOTE — Progress Notes (Signed)
Patient leaving against medical advice. IV removed, WNL. MD aware. Patient made aware of risks of leaving AMA and is insistent on leaving.  Quita Skye, RN

## 2017-04-22 LAB — HIV ANTIBODY (ROUTINE TESTING W REFLEX): HIV SCREEN 4TH GENERATION: NONREACTIVE

## 2017-04-25 LAB — CULTURE, BLOOD (SINGLE)
Culture: NO GROWTH
Special Requests: ADEQUATE

## 2017-04-25 LAB — AEROBIC/ANAEROBIC CULTURE (SURGICAL/DEEP WOUND): CULTURE: NO GROWTH

## 2017-04-25 LAB — AEROBIC/ANAEROBIC CULTURE W GRAM STAIN (SURGICAL/DEEP WOUND)

## 2017-04-26 ENCOUNTER — Ambulatory Visit (INDEPENDENT_AMBULATORY_CARE_PROVIDER_SITE_OTHER): Payer: Self-pay | Admitting: Orthopedic Surgery

## 2017-04-26 ENCOUNTER — Ambulatory Visit (INDEPENDENT_AMBULATORY_CARE_PROVIDER_SITE_OTHER): Payer: Self-pay

## 2017-04-26 ENCOUNTER — Encounter: Payer: Self-pay | Admitting: Orthopedic Surgery

## 2017-04-26 VITALS — BP 119/84 | HR 84 | Wt 157.0 lb

## 2017-04-26 DIAGNOSIS — M79642 Pain in left hand: Secondary | ICD-10-CM

## 2017-04-26 DIAGNOSIS — L03114 Cellulitis of left upper limb: Secondary | ICD-10-CM

## 2017-04-26 MED ORDER — IBUPROFEN 800 MG PO TABS: 800.0000 mg | ORAL_TABLET | Freq: Three times a day (TID) | ORAL | 1 refills | Status: DC | PRN

## 2017-04-26 MED ORDER — OXYCODONE-ACETAMINOPHEN 5-325 MG PO TABS: 1.0000 | ORAL_TABLET | ORAL | 0 refills | Status: DC | PRN

## 2017-04-26 NOTE — Patient Instructions (Signed)
Soak hand in warm water 1 drop of dial or dawn and a little table salt for 10 min twice a day

## 2017-04-26 NOTE — Progress Notes (Signed)
Postop visit  Encounter Diagnoses  Name Primary?  . Pain of left hand Yes  . Cellulitis of left hand    Incision and drainage of a prior dog bite. She is on Augmentin and Percocet. She is complaining of pain in her hand ulnar side as well as the thumb. She is concerned antibiotics not working as she had a couple of places of redness, been the hand. She says the pain radiates up into the arm at times. However, her swelling has gone down the erythema has gone down she does have an area of bruising and tenderness on the ulnar side of the hand which were require x-ray  Her cultures have been negative him in a stop the antibiotics and give her something for pain which will include ibuprofen and Percocet and then start soaking the finger twice a day return on next Friday

## 2017-04-28 ENCOUNTER — Ambulatory Visit: Payer: Self-pay | Admitting: Orthopedic Surgery

## 2017-05-05 ENCOUNTER — Ambulatory Visit: Payer: Self-pay | Admitting: Orthopedic Surgery

## 2017-05-09 ENCOUNTER — Ambulatory Visit (INDEPENDENT_AMBULATORY_CARE_PROVIDER_SITE_OTHER): Payer: Self-pay | Admitting: Orthopedic Surgery

## 2017-05-09 ENCOUNTER — Encounter: Payer: Self-pay | Admitting: Orthopedic Surgery

## 2017-05-09 VITALS — BP 124/85 | HR 80 | Ht <= 58 in | Wt 151.0 lb

## 2017-05-09 DIAGNOSIS — L03114 Cellulitis of left upper limb: Secondary | ICD-10-CM

## 2017-05-09 MED ORDER — HYDROCODONE-ACETAMINOPHEN 10-325 MG PO TABS: 1.0000 | ORAL_TABLET | ORAL | 0 refills | Status: DC | PRN

## 2017-05-09 NOTE — Progress Notes (Signed)
Progress Note   Patient ID: Cathy [name], female   DOB: 11/18/1980, 36 y.o.   MRN: [id number]  Chief Complaint  Patient presents with  . Post-op Follow-up    left hand s/p dog bite   she is now postop day number 19 date of surgery September 27  HPI Zandra had incision and drainage secondary to dog bite she is improving her pain and stiffness now involve just the thumb from the metacarpophalangeal joint distally. She has a little bit of motion at the IP joint and the metacarpophalangeal joint. The redness is gone her pain is improving though she still hurts  ROS Current Meds  Medication Sig  . ibuprofen (ADVIL,MOTRIN) 800 MG tablet Take 1 tablet (800 mg total) by mouth every 8 (eight) hours as needed.  Marland Kitchen oxyCODONE-acetaminophen (PERCOCET/ROXICET) 5-325 MG tablet Take 1 tablet by mouth every 4 (four) hours as needed for severe pain.     Physical Exam BP 124/85   Pulse 80   Ht 4\' 10"  (1.473 m)   Wt 151 lb (68.5 kg)   BMI 31.56 kg/m   Gen. appearance the patient's appearance is normal with normal grooming and  hygiene The patient is oriented to person place and time Mood and affect are normal  BP 124/85   Pulse 80   Ht 4\' 10"  (1.473 m)   Wt 151 lb (68.5 kg)   BMI 31.56 kg/m  Ortho Exam  The sutures were removed from the thumb both wounds. Both wounds look good no erythema. Stiffness as stated above in the joints as noted  Medical decision-making Encounter Diagnosis  Name Primary?  . Cellulitis of left hand s/p I&D 04/20/17 Yes      Meds ordered this encounter  Medications  . HYDROcodone-acetaminophen (NORCO) 10-325 MG tablet    Sig: Take 1 tablet by mouth every 4 (four) hours as needed.    Dispense:  42 tablet    Refill:  0   Recommend occupational therapy three-week follow-up  , MD 05/09/2017 4:31 PM

## 2017-05-17 ENCOUNTER — Ambulatory Visit (HOSPITAL_COMMUNITY): Payer: Self-pay

## 2017-05-18 ENCOUNTER — Telehealth: Payer: Self-pay | Admitting: Orthopedic Surgery

## 2017-05-18 NOTE — Telephone Encounter (Signed)
Hydrocodone-Acetaminophen  10/325mg  Qty 42 Tablets ° °Take 1 tablet by mouth every 4 (four) hours as needed. °

## 2017-05-19 ENCOUNTER — Other Ambulatory Visit: Payer: Self-pay | Admitting: Orthopedic Surgery

## 2017-05-19 MED ORDER — HYDROCODONE-ACETAMINOPHEN 5-325 MG PO TABS: 1.0000 | ORAL_TABLET | Freq: Four times a day (QID) | ORAL | 0 refills | Status: DC | PRN

## 2017-05-19 NOTE — Progress Notes (Signed)
norco 5

## 2017-05-29 ENCOUNTER — Telehealth: Payer: Self-pay | Admitting: Orthopedic Surgery

## 2017-05-29 NOTE — Telephone Encounter (Signed)
Patient called to request refill:  HYDROcodone-acetaminophen (NORCO) 5-325 MG tablet 30 tablet 0 05/19/2017

## 2017-05-29 NOTE — Telephone Encounter (Signed)
Has to be seen

## 2017-05-30 ENCOUNTER — Ambulatory Visit (HOSPITAL_COMMUNITY): Payer: Self-pay | Attending: Orthopedic Surgery | Admitting: Occupational Therapy

## 2017-05-30 NOTE — Telephone Encounter (Signed)
Called patient to notify. No answer or voice mail.

## 2017-05-31 NOTE — Telephone Encounter (Signed)
Patient returned call, appointment has been scheduled; aware.

## 2017-06-05 ENCOUNTER — Ambulatory Visit (HOSPITAL_COMMUNITY): Payer: Self-pay | Admitting: Specialist

## 2017-06-06 ENCOUNTER — Ambulatory Visit (INDEPENDENT_AMBULATORY_CARE_PROVIDER_SITE_OTHER): Payer: Self-pay | Admitting: Orthopedic Surgery

## 2017-06-06 ENCOUNTER — Encounter: Payer: Self-pay | Admitting: Orthopedic Surgery

## 2017-06-06 VITALS — BP 133/90 | HR 70 | Ht <= 58 in | Wt 155.0 lb

## 2017-06-06 DIAGNOSIS — Z4889 Encounter for other specified surgical aftercare: Secondary | ICD-10-CM

## 2017-06-06 DIAGNOSIS — L03114 Cellulitis of left upper limb: Secondary | ICD-10-CM

## 2017-06-06 MED ORDER — HYDROCODONE-ACETAMINOPHEN 5-325 MG PO TABS: 1.0000 | ORAL_TABLET | Freq: Four times a day (QID) | ORAL | 0 refills | Status: DC | PRN

## 2017-06-06 NOTE — Progress Notes (Signed)
Encounter Diagnoses  Name Primary?  . Cellulitis of left hand s/p I&D 04/20/17 Yes  . Aftercare following surgery    Status post incision and drainage left thumb dorsal and volar  Volar aspect if hypersensitive to touch dorsal lip wound healed there is no erythema in the hand or forearm  Stiffness of the IP joint of the thumb is noted with painful flexion extension  Desensitization with tooth brush x 6 weeks   Meds ordered this encounter  Medications  . HYDROcodone-acetaminophen (NORCO) 5-325 MG tablet    Sig: Take 1 tablet every 6 (six) hours as needed by mouth for moderate pain.    Dispense:  30 tablet    Refill:  0

## 2017-06-06 NOTE — Patient Instructions (Signed)
Toothbrush for desensitization as instructed Soft for 2 weeks Medium for 2 weeks Firm for 2 weeks

## 2017-06-14 ENCOUNTER — Other Ambulatory Visit: Payer: Self-pay | Admitting: Orthopedic Surgery

## 2017-06-14 ENCOUNTER — Telehealth: Payer: Self-pay | Admitting: Orthopedic Surgery

## 2017-06-14 DIAGNOSIS — M79643 Pain in unspecified hand: Secondary | ICD-10-CM

## 2017-06-14 MED ORDER — HYDROCODONE-ACETAMINOPHEN 5-325 MG PO TABS: 1.0000 | ORAL_TABLET | Freq: Four times a day (QID) | ORAL | 0 refills | Status: DC | PRN

## 2017-06-14 NOTE — Telephone Encounter (Signed)
READY

## 2017-06-14 NOTE — Telephone Encounter (Signed)
Patient aware; pick up today, 06/14/17.

## 2017-06-14 NOTE — Telephone Encounter (Signed)
Patient called for refill,medication:  HYDROcodone-acetaminophen (NORCO) 5-325 MG tablet 30 tablet

## 2017-06-20 ENCOUNTER — Ambulatory Visit: Payer: Self-pay | Admitting: Orthopedic Surgery

## 2017-06-23 ENCOUNTER — Encounter (HOSPITAL_COMMUNITY): Payer: Self-pay | Admitting: Emergency Medicine

## 2017-06-23 ENCOUNTER — Emergency Department (HOSPITAL_COMMUNITY)
Admission: EM | Admit: 2017-06-23 | Discharge: 2017-06-23 | Disposition: A | Discharge status: Home / Self Care | Payer: Self-pay | Attending: Emergency Medicine | Admitting: Emergency Medicine

## 2017-06-23 DIAGNOSIS — M069 Rheumatoid arthritis, unspecified: Secondary | ICD-10-CM

## 2017-06-23 DIAGNOSIS — F17210 Nicotine dependence, cigarettes, uncomplicated: Secondary | ICD-10-CM | POA: Insufficient documentation

## 2017-06-23 DIAGNOSIS — M06841 Other specified rheumatoid arthritis, right hand: Secondary | ICD-10-CM | POA: Insufficient documentation

## 2017-06-23 DIAGNOSIS — M79641 Pain in right hand: Secondary | ICD-10-CM

## 2017-06-23 MED ORDER — PREDNISONE 20 MG PO TABS
40.0000 mg | ORAL_TABLET | Freq: Once | ORAL | Status: AC
  Administered 2017-06-23: 40 mg via ORAL
  Filled 2017-06-23: qty 2

## 2017-06-23 MED ORDER — IBUPROFEN 400 MG PO TABS
600.0000 mg | ORAL_TABLET | Freq: Once | ORAL | Status: AC
  Administered 2017-06-23: 600 mg via ORAL
  Filled 2017-06-23: qty 2

## 2017-06-23 MED ORDER — PREDNISONE 20 MG PO TABS: ORAL_TABLET | ORAL | 0 refills | Status: DC

## 2017-06-23 NOTE — Discharge Instructions (Signed)
Use ice as needed.  Take steroids and follow up with primary doctor If you were given medicines take as directed.  If you are on coumadin or contraceptives realize their levels and effectiveness is altered by many different medicines.  If you have any reaction (rash, tongues swelling, other) to the medicines stop taking and see a physician.    If your blood pressure was elevated in the ER make sure you follow up for management with a primary doctor or return for chest pain, shortness of breath or stroke symptoms.  Please follow up as directed and return to the ER or see a physician for new or worsening symptoms.  Thank you. Vitals:   06/23/17 0802 06/23/17 0803  Pulse: 75   Resp: 16   Temp: 98 F (36.7 C)   TempSrc: Oral   SpO2: 99%   Weight:  70.3 kg (155 lb)  Height:  4\' 10"  (1.473 m)

## 2017-06-23 NOTE — ED Provider Notes (Signed)
[facility] EMERGENCY DEPARTMENT Provider Note   CSN: [id number] Arrival date & time: 06/23/17  0750     History   Chief Complaint Chief Complaint  Patient presents with  . Joint Pain    HPI Cathy [name] is a 36 y.o. female.  Patient with rheumatoid arthritis history not currently on any medications and does not have a primary doctor, cigarette smoker presents with worsening right hand pain with radiation up to the right forearm. No fevers or significant swelling. Pain with flexion of the fingers and wrist. No injuries. Feels similar to rheumatoid history.      Past Medical History:  Diagnosis Date  . Chronic knee pain   . GERD (gastroesophageal reflux disease)   . Heart murmur of newborn   . RA (rheumatoid arthritis) [facility])     Patient Active Problem List   Diagnosis Date Noted  . Tobacco use 04/20/2017  . Felon of finger of left hand   . Cellulitis of left hand 04/19/2017  . GERD (gastroesophageal reflux disease) 04/19/2017  . RA (rheumatoid arthritis) (HCC) 04/19/2017    Past Surgical History:  Procedure Laterality Date  . ABDOMINAL HYSTERECTOMY     total  . CHOLECYSTECTOMY    . FOOT SURGERY    . INCISION AND DRAINAGE Left 04/20/2017   Procedure: INCISION AND DRAINAGE LEFT THUMB;  Surgeon: Vickki Hearing, MD;  Location: [facility];  Service: Orthopedics;  Laterality: Left;  CULTURE ANEROBIC AEROBIC  . TUBAL LIGATION      OB History    No data available       Home Medications    Prior to Admission medications   Medication Sig Start Date End Date Taking? Authorizing Provider  HYDROcodone-acetaminophen (NORCO) 5-325 MG tablet Take 1 tablet by mouth every 6 (six) hours as needed for moderate pain. 06/14/17   Vickki Hearing, MD  ibuprofen (ADVIL,MOTRIN) 800 MG tablet Take 1 tablet (800 mg total) by mouth every 8 (eight) hours as needed. 04/26/17   Vickki Hearing, MD  predniSONE (DELTASONE) 20 MG tablet 3 tabs po day one, then 2 tabs daily x 4  days 06/23/17   Blane Ohara, MD    Family History Family History  Problem Relation Age of Onset  . Alzheimer's disease Mother   . Heart disease Father   . Arthritis Father        rheumatoid  . Arthritis Sister        rheumatoid  . CAD Sister        "Died of massive Heart attack at age 46"    Social History Social History   Tobacco Use  . Smoking status: Current Every Day Smoker    Packs/day: 0.50    Types: Cigarettes  . Smokeless tobacco: Never Used  Substance Use Topics  . Alcohol use: No  . Drug use: No     Allergies   Ciprofloxacin   Review of Systems Review of Systems  Constitutional: Negative for chills and fever.  HENT: Negative for congestion.   Gastrointestinal: Negative for abdominal pain and vomiting.  Genitourinary: Negative for dysuria and flank pain.  Musculoskeletal: Positive for arthralgias and joint swelling. Negative for back pain, neck pain and neck stiffness.  Skin: Negative for rash.     Physical Exam Updated Vital Signs Pulse 75   Temp 98 F (36.7 C) (Oral)   Resp 16   Ht 4\' 10"  (1.473 m)   Wt 70.3 kg (155 lb)   SpO2 99%   BMI  32.40 kg/m   Physical Exam  Constitutional: She is oriented to person, place, and time. She appears well-developed and well-nourished.  HENT:  Head: Normocephalic and atraumatic.  Eyes: Right eye exhibits no discharge. Left eye exhibits no discharge.  Neck: Neck supple. No tracheal deviation present.  Cardiovascular: Normal rate.  Pulmonary/Chest: Effort normal.  Musculoskeletal: She exhibits tenderness. She exhibits no edema.  Patient has tenderness with flexion of PIP and MCP joints in the right hand. No significant edema. No significant warmth or edema to the right wrist or elbow. No rashes. Neurovascularly intact right arm.  Neurological: She is alert and oriented to person, place, and time.  Skin: Skin is warm. No rash noted.  Psychiatric: She has a normal mood and affect.  Nursing note and  vitals reviewed.    ED Treatments / Results  Labs (all labs ordered are listed, but only abnormal results are displayed) Labs Reviewed - No data to display  EKG  EKG Interpretation None       Radiology No results found.  Procedures Procedures (including critical care time)  Medications Ordered in ED Medications  predniSONE (DELTASONE) tablet 40 mg (not administered)  ibuprofen (ADVIL,MOTRIN) tablet 600 mg (not administered)     Initial Impression / Assessment and Plan / ED Course  I have reviewed the triage vital signs and the nursing notes.  Pertinent labs & imaging results that were available during my care of the patient were reviewed by me and considered in my medical decision making (see chart for details).    Patient presents with clinically arthritis flare. Discussed importance of obtaining a primary doctor for follow-up in trial of prednisone. Discussed reasons to return. No evidence of infection at this time.  Splint for comfort in ED.   Final Clinical Impressions(s) / ED Diagnoses   Final diagnoses:  Rheumatoid arthritis involving right hand, unspecified rheumatoid factor presence (HCC)  Right hand pain    ED Discharge Orders        Ordered    predniSONE (DELTASONE) 20 MG tablet     06/23/17 0834       Blane Ohara, MD 06/23/17 [phone]

## 2017-06-23 NOTE — ED Triage Notes (Signed)
Pt with hx of rheumatoid arthritis c/o RT hand pain radiating up arm x 1 week. Pt does not see a doctor at this time to manage symptoms.

## 2017-06-28 ENCOUNTER — Telehealth: Payer: Self-pay | Admitting: Orthopedic Surgery

## 2017-06-28 ENCOUNTER — Other Ambulatory Visit: Payer: Self-pay | Admitting: Radiology

## 2017-06-28 ENCOUNTER — Other Ambulatory Visit: Payer: Self-pay | Admitting: Orthopedic Surgery

## 2017-06-28 DIAGNOSIS — M79642 Pain in left hand: Secondary | ICD-10-CM

## 2017-06-28 DIAGNOSIS — L03114 Cellulitis of left upper limb: Secondary | ICD-10-CM

## 2017-06-28 MED ORDER — IBUPROFEN 800 MG PO TABS: 800.0000 mg | ORAL_TABLET | Freq: Three times a day (TID) | ORAL | 1 refills | Status: DC | PRN

## 2017-06-28 NOTE — Telephone Encounter (Signed)
Patient called and I relayed your reply. She stated that the Ibuprofen  Naproxen would be fine.

## 2017-06-28 NOTE — Telephone Encounter (Signed)
Done pick up at [facility]

## 2017-06-28 NOTE — Telephone Encounter (Signed)
Cannot prescribe opioid medication   We can prescribe ibuprofen naproxen

## 2017-06-28 NOTE — Telephone Encounter (Signed)
Patient called for refill:   °HYDROcodone-acetaminophen (NORCO) 5-325 MG tablet 30 tablet  ° ° °

## 2017-06-28 NOTE — Telephone Encounter (Signed)
Resent ibuprofen, previous printed instead of submitted

## 2017-07-26 ENCOUNTER — Encounter: Payer: Self-pay | Admitting: Orthopedic Surgery

## 2017-07-26 ENCOUNTER — Telehealth: Payer: Self-pay | Admitting: Radiology

## 2017-07-26 ENCOUNTER — Ambulatory Visit (INDEPENDENT_AMBULATORY_CARE_PROVIDER_SITE_OTHER): Payer: Self-pay | Admitting: Orthopedic Surgery

## 2017-07-26 VITALS — BP 110/75 | HR 87 | Ht <= 58 in | Wt 158.0 lb

## 2017-07-26 DIAGNOSIS — M069 Rheumatoid arthritis, unspecified: Secondary | ICD-10-CM

## 2017-07-26 DIAGNOSIS — L03114 Cellulitis of left upper limb: Secondary | ICD-10-CM

## 2017-07-26 MED ORDER — PREDNISONE 10 MG PO TABS: 20.0000 mg | ORAL_TABLET | Freq: Every day | ORAL | 0 refills | Status: DC

## 2017-07-26 NOTE — Progress Notes (Signed)
Follow up  Chief Complaint  Patient presents with  . Routine Post Op    04/20/17 s/p I and D 04/20/17   Patient with history of rheumatoid arthritis previously on prednisone presents back to [place] for treatment of her rheumatoid arthritis and follow-up regarding the incision and drainage of her left hand  She complains of some tenderness over the DIP joint but she has managed her desensitization exercises well with improvement well-healed incision of the dorsum of the left thumb with  Slight decrease in IP joint flexion there are no signs of infection    She needs to have referral to the rheumatologist and in the meantime I will place her back on the 10 mg twice a day of prednisone  Encounter Diagnoses  Name Primary?  . Cellulitis of left hand s/p I and D 04/20/17   . Rheumatoid arthritis, involving unspecified site, unspecified rheumatoid factor presence (HCC) Yes

## 2017-07-26 NOTE — Telephone Encounter (Signed)
Patient was told to get Medical records for the appointment with Rheumatologist. I will ck with her next week and see if she has them. She will need them to be faxed over to the Rheumatologist before they will consider her for appointment.

## 2017-07-30 ENCOUNTER — Emergency Department (HOSPITAL_COMMUNITY)
Admission: EM | Admit: 2017-07-30 | Discharge: 2017-07-30 | Disposition: A | Discharge status: Home / Self Care | Payer: Self-pay | Attending: Emergency Medicine | Admitting: Emergency Medicine

## 2017-07-30 ENCOUNTER — Emergency Department (HOSPITAL_COMMUNITY): Payer: Self-pay

## 2017-07-30 ENCOUNTER — Other Ambulatory Visit: Payer: Self-pay

## 2017-07-30 ENCOUNTER — Encounter (HOSPITAL_COMMUNITY): Payer: Self-pay | Admitting: Emergency Medicine

## 2017-07-30 DIAGNOSIS — F17210 Nicotine dependence, cigarettes, uncomplicated: Secondary | ICD-10-CM | POA: Insufficient documentation

## 2017-07-30 DIAGNOSIS — B349 Viral infection, unspecified: Secondary | ICD-10-CM | POA: Insufficient documentation

## 2017-07-30 DIAGNOSIS — B9789 Other viral agents as the cause of diseases classified elsewhere: Secondary | ICD-10-CM

## 2017-07-30 DIAGNOSIS — M940 Chondrocostal junction syndrome [Tietze]: Secondary | ICD-10-CM | POA: Insufficient documentation

## 2017-07-30 DIAGNOSIS — J069 Acute upper respiratory infection, unspecified: Secondary | ICD-10-CM | POA: Insufficient documentation

## 2017-07-30 MED ORDER — ALBUTEROL SULFATE HFA 108 (90 BASE) MCG/ACT IN AERS: 1.0000 | INHALATION_SPRAY | Freq: Four times a day (QID) | RESPIRATORY_TRACT | 0 refills | Status: AC | PRN

## 2017-07-30 MED ORDER — IPRATROPIUM-ALBUTEROL 0.5-2.5 (3) MG/3ML IN SOLN
3.0000 mL | Freq: Once | RESPIRATORY_TRACT | Status: AC
  Administered 2017-07-30: 3 mL via RESPIRATORY_TRACT
  Filled 2017-07-30: qty 3

## 2017-07-30 MED ORDER — HYDROCODONE-HOMATROPINE 5-1.5 MG/5ML PO SYRP
5.0000 mL | ORAL_SOLUTION | Freq: Four times a day (QID) | ORAL | 0 refills | Status: DC | PRN
Start: 2017-07-30 — End: 2017-09-01

## 2017-07-30 NOTE — ED Triage Notes (Signed)
Patient c/o cough with wheezing and fevers. Patient states sputum thick green sputum. Patient reports taking tylenol for fevers with last dose 2 hours ago. Patient states pain in chest with cough or deep breath.

## 2017-07-30 NOTE — Discharge Instructions (Signed)
Chest x-ray was normal without pneumonia.   Symptoms likely from upper respiratory infection. Repeated coughing has led to costochondritis or inflammation of rib cartilage.  Use albuterol inhaler every 4-6 hour for cough and chest tightness. Hycodan syrup is a narcotic pain medication syrup with cough suppressant, use this at night time to stop cough and get some rest. Use mucinex during the day to help thin out mucus and cough it out. Stay well hydrated. Smoking will make your symptoms worse and prolong illness.   Return to emergency department if you become short of breath, has exertional chest pain, worsening symptoms.

## 2017-07-30 NOTE — ED Notes (Signed)
RT notified for neb tx. 

## 2017-07-30 NOTE — ED Provider Notes (Signed)
[facility] EMERGENCY DEPARTMENT Provider Note   CSN: [id number] Arrival date & time: 07/30/17  1715     History   Chief Complaint Chief Complaint  Patient presents with  . Cough    HPI Cathy [name] is a 37 y.o. female with history of rheumatoid arthritis, tobacco abuse presents to ED for evaluation of productive cough for 4 days. Every time she coughs and takes a deep breath she has left-sided chest pain, denies exertional chest pain or shortness of breath, palpitations, dizziness, nausea, diaphoresis. Associated symptoms include increased rhinorrhea, chills, subjective fevers last night. Known sick contacts at home. Has not been on methotrexate or prednisone for several weeks recently. States she has had pneumonia in the past and this feels similar. Took Tylenol PTA without relief of symptoms. No other interventions PTA. No aggravating or alleviating factors.  HPI  Past Medical History:  Diagnosis Date  . Chronic knee pain   . GERD (gastroesophageal reflux disease)   . Heart murmur of newborn   . RA (rheumatoid arthritis) [place])     Patient Active Problem List   Diagnosis Date Noted  . Tobacco use 04/20/2017  . Felon of finger of left hand   . Cellulitis of left hand s/p I and D 04/20/17 04/19/2017  . GERD (gastroesophageal reflux disease) 04/19/2017  . RA (rheumatoid arthritis) (HCC) 04/19/2017    Past Surgical History:  Procedure Laterality Date  . ABDOMINAL HYSTERECTOMY     total  . CHOLECYSTECTOMY    . FOOT SURGERY    . INCISION AND DRAINAGE Left 04/20/2017   Procedure: INCISION AND DRAINAGE LEFT THUMB;  Surgeon: Vickki Hearing, MD;  Location: [facility];  Service: Orthopedics;  Laterality: Left;  CULTURE ANEROBIC AEROBIC  . TUBAL LIGATION      OB History    No data available       Home Medications    Prior to Admission medications   Medication Sig Start Date End Date Taking? Authorizing Provider  albuterol (PROVENTIL HFA;VENTOLIN HFA) 108 (90 Base)  MCG/ACT inhaler Inhale 1-2 puffs into the lungs every 6 (six) hours as needed for wheezing or shortness of breath. 07/30/17   Liberty Handy, PA-C  HYDROcodone-acetaminophen (NORCO) 5-325 MG tablet Take 1 tablet by mouth every 6 (six) hours as needed for moderate pain. 06/14/17   Vickki Hearing, MD  HYDROcodone-homatropine [facility]) 5-1.5 MG/5ML syrup Take 5 mLs by mouth every 6 (six) hours as needed for cough. FOR DISRUPTIVE NIGHT TIME COUGH AND BODY ACHES 07/30/17   Liberty Handy, PA-C  ibuprofen (ADVIL,MOTRIN) 800 MG tablet Take 1 tablet (800 mg total) by mouth every 8 (eight) hours as needed. 06/28/17   Vickki Hearing, MD  predniSONE (DELTASONE) 10 MG tablet Take 2 tablets (20 mg total) by mouth daily. 07/26/17   Vickki Hearing, MD    Family History Family History  Problem Relation Age of Onset  . Alzheimer's disease Mother   . Heart disease Father   . Arthritis Father        rheumatoid  . Arthritis Sister        rheumatoid  . CAD Sister        "Died of massive Heart attack at age 75"    Social History Social History   Tobacco Use  . Smoking status: Current Every Day Smoker    Packs/day: 0.50    Types: Cigarettes  . Smokeless tobacco: Never Used  Substance Use Topics  . Alcohol use: No  .  Drug use: No     Allergies   Ciprofloxacin   Review of Systems Review of Systems  HENT: Positive for congestion and rhinorrhea.   Respiratory: Positive for cough, chest tightness, shortness of breath and wheezing.   Cardiovascular: Positive for chest pain.  All other systems reviewed and are negative.    Physical Exam Updated Vital Signs BP 133/84 (BP Location: Right Arm)   Pulse 68   Temp 98 F (36.7 C) (Oral)   Resp 20   Ht 4\' 10"  (1.473 m)   Wt 71.7 kg (158 lb)   SpO2 96%   BMI 33.02 kg/m   Physical Exam  Constitutional: She is oriented to person, place, and time. She appears well-developed and well-nourished. No distress.  NAD. Nontoxic. Speaking  in full sentences.  HENT:  Head: Normocephalic and atraumatic.  Right Ear: External ear normal.  Left Ear: External ear normal.  Mild mucosal edema without rhinorrhea. Oropharynx and tonsils normal without edema, erythema, hypertrophy. Moist mucous membranes.  Eyes: Conjunctivae and EOM are normal. No scleral icterus.  Neck: Normal range of motion. Neck supple.  Bilateral submandibular lymphadenopathy. No significant anterior neck swelling. Full passive range of motion of neck without rigidity.  Cardiovascular: Normal rate, regular rhythm and normal heart sounds.  No murmur heard. Pulmonary/Chest: Effort normal. She has wheezes.  Faint expiratory wheezing to left upper lobe posteriorly.  Reproducible left-sided chest/breast tenderness. Patient reports pain to this area with deep inspiration and while coughing on exam. Deep inspiration causes pain to cough. Patient speaking in full sentences with normal work of breathing.   Abdominal: Soft. There is no tenderness.  Musculoskeletal: Normal range of motion. She exhibits no deformity.  Neurological: She is alert and oriented to person, place, and time.  Skin: Skin is warm and dry. Capillary refill takes less than 2 seconds.  Psychiatric: She has a normal mood and affect. Her behavior is normal. Judgment and thought content normal.  Nursing note and vitals reviewed.    ED Treatments / Results  Labs (all labs ordered are listed, but only abnormal results are displayed) Labs Reviewed - No data to display  EKG  EKG Interpretation None       Radiology Dg Chest 2 View  Result Date: 07/30/2017 CLINICAL DATA:  Left-sided chest pain.  Productive cough. EXAM: CHEST  2 VIEW COMPARISON:  April 28, 2015 FINDINGS: No pneumothorax. The heart, hila, and mediastinum are normal. No nodules or masses. No focal infiltrate. Mild atelectasis in the left base. IMPRESSION: No active cardiopulmonary disease. Electronically Signed   By: Gerome Sam III  M.D   On: 07/30/2017 18:38    Procedures Procedures (including critical care time)  Medications Ordered in ED Medications  ipratropium-albuterol (DUONEB) 0.5-2.5 (3) MG/3ML nebulizer solution 3 mL (3 mLs Nebulization Given 07/30/17 1819)     Initial Impression / Assessment and Plan / ED Course  I have reviewed the triage vital signs and the nursing notes.  Pertinent labs & imaging results that were available during my care of the patient were reviewed by me and considered in my medical decision making (see chart for details).    37 y.o. -year-old female with ppmh of tobacco abuse presents with URI like symptoms  3-4 days. Having symptoms c/w costochondritis. No exertional CP or SOB. Known sick contacts. On my exam patient is nontoxic appearing, speaking in full sentences, w/o increased WOB. No fever, tachypnea, tachycardia, hypoxia. Initially with faint exipratory wheezing but this resolved. CXR negative. No significant  h/o immunocompromise. Doubt pneumonia.  CP does not sound like cardiac in nature, and likely from repetitive coughing. Given reassuring physical exam, will discharge with symptomatic treatment. Strict ED return precautions given. Patient is aware that a viral URI infection may precede pneumonia or worsening illness. Patient is aware of red flag symptoms to monitor for that would warrant return to the ED for further reevaluation.    Final Clinical Impressions(s) / ED Diagnoses   Final diagnoses:  Viral URI with cough  Costochondritis    ED Discharge Orders        Ordered    albuterol (PROVENTIL HFA;VENTOLIN HFA) 108 (90 Base) MCG/ACT inhaler  Every 6 hours PRN     07/30/17 1902    HYDROcodone-homatropine (HYCODAN) 5-1.5 MG/5ML syrup  Every 6 hours PRN     07/30/17 1902       Liberty Handy, PA-C 07/30/17 1908    Vanetta Mulders, MD 07/31/17 [phone]

## 2017-08-01 NOTE — Telephone Encounter (Signed)
I have called patient about records she states when she called to get them she was told the Doctor would have to request them, she will come by today or tomorrow to sign a medical records release form.

## 2017-09-01 ENCOUNTER — Other Ambulatory Visit: Payer: Self-pay

## 2017-09-01 ENCOUNTER — Emergency Department (HOSPITAL_COMMUNITY): Payer: Self-pay

## 2017-09-01 ENCOUNTER — Encounter (HOSPITAL_COMMUNITY): Payer: Self-pay | Admitting: Emergency Medicine

## 2017-09-01 ENCOUNTER — Emergency Department (HOSPITAL_COMMUNITY)
Admission: EM | Admit: 2017-09-01 | Discharge: 2017-09-01 | Disposition: A | Discharge status: Home / Self Care | Payer: Self-pay | Attending: Emergency Medicine | Admitting: Emergency Medicine

## 2017-09-01 DIAGNOSIS — J441 Chronic obstructive pulmonary disease with (acute) exacerbation: Secondary | ICD-10-CM | POA: Insufficient documentation

## 2017-09-01 DIAGNOSIS — L03114 Cellulitis of left upper limb: Secondary | ICD-10-CM

## 2017-09-01 DIAGNOSIS — F17210 Nicotine dependence, cigarettes, uncomplicated: Secondary | ICD-10-CM | POA: Insufficient documentation

## 2017-09-01 DIAGNOSIS — J209 Acute bronchitis, unspecified: Secondary | ICD-10-CM | POA: Insufficient documentation

## 2017-09-01 DIAGNOSIS — Z9049 Acquired absence of other specified parts of digestive tract: Secondary | ICD-10-CM | POA: Insufficient documentation

## 2017-09-01 DIAGNOSIS — M79642 Pain in left hand: Secondary | ICD-10-CM

## 2017-09-01 MED ORDER — METHYLPREDNISOLONE SODIUM SUCC 125 MG IJ SOLR
125.0000 mg | Freq: Once | INTRAMUSCULAR | Status: AC
  Administered 2017-09-01: 125 mg via INTRAVENOUS
  Filled 2017-09-01: qty 2

## 2017-09-01 MED ORDER — SODIUM CHLORIDE 0.9 % IV BOLUS (SEPSIS)
1000.0000 mL | Freq: Once | INTRAVENOUS | Status: AC
  Administered 2017-09-01: 1000 mL via INTRAVENOUS

## 2017-09-01 MED ORDER — KETOROLAC TROMETHAMINE 30 MG/ML IJ SOLN
15.0000 mg | Freq: Once | INTRAMUSCULAR | Status: AC
  Administered 2017-09-01: 15 mg via INTRAVENOUS
  Filled 2017-09-01: qty 1

## 2017-09-01 MED ORDER — ALBUTEROL SULFATE HFA 108 (90 BASE) MCG/ACT IN AERS
2.0000 | INHALATION_SPRAY | Freq: Four times a day (QID) | RESPIRATORY_TRACT | Status: DC
  Administered 2017-09-01: 2 via RESPIRATORY_TRACT
  Filled 2017-09-01: qty 6.7

## 2017-09-01 MED ORDER — TRAMADOL HCL 50 MG PO TABS: 50.0000 mg | ORAL_TABLET | Freq: Four times a day (QID) | ORAL | 0 refills | Status: AC | PRN

## 2017-09-01 MED ORDER — TRAMADOL HCL 50 MG PO TABS
50.0000 mg | ORAL_TABLET | Freq: Once | ORAL | Status: AC
  Administered 2017-09-01: 50 mg via ORAL
  Filled 2017-09-01: qty 1

## 2017-09-01 MED ORDER — IPRATROPIUM-ALBUTEROL 0.5-2.5 (3) MG/3ML IN SOLN
RESPIRATORY_TRACT | Status: AC
  Administered 2017-09-01: 3 mL
  Filled 2017-09-01: qty 3

## 2017-09-01 MED ORDER — ALBUTEROL SULFATE (2.5 MG/3ML) 0.083% IN NEBU
5.0000 mg | INHALATION_SOLUTION | Freq: Once | RESPIRATORY_TRACT | Status: DC
  Administered 2017-09-01: 5 mg via RESPIRATORY_TRACT
  Filled 2017-09-01: qty 6

## 2017-09-01 MED ORDER — ALBUTEROL (5 MG/ML) CONTINUOUS INHALATION SOLN
10.0000 mg/h | INHALATION_SOLUTION | RESPIRATORY_TRACT | Status: AC
  Administered 2017-09-01: 10 mg/h via RESPIRATORY_TRACT
  Filled 2017-09-01: qty 20

## 2017-09-01 MED ORDER — PREDNISONE 20 MG PO TABS: 40.0000 mg | ORAL_TABLET | Freq: Every day | ORAL | 0 refills | Status: AC

## 2017-09-01 MED ORDER — ALBUTEROL SULFATE (2.5 MG/3ML) 0.083% IN NEBU: 5.0000 mg | INHALATION_SOLUTION | Freq: Once | RESPIRATORY_TRACT | Status: DC

## 2017-09-01 NOTE — Discharge Instructions (Addendum)
As discussed, the single most important thing you can do to get better is to stop smoking. For the next 2 days please use the provided albuterol every 4 hours of steroids.  Please make an appointment with your physician for follow-up care, otherwise return here for concerning changes in your condition.

## 2017-09-01 NOTE — ED Triage Notes (Signed)
Pt c/o of productive cough and rib pain x 3 days with SOB worsening today.  Denies new injury

## 2017-09-01 NOTE — ED Provider Notes (Signed)
[facility] EMERGENCY DEPARTMENT Provider Note   CSN: [id number] Arrival date & time: 09/01/17  1516     History   Chief Complaint Chief Complaint  Patient presents with  . Shortness of Breath    HPI Cathy [name] is a 37 y.o. female.  HPI Patient presents with concern of cough, fever, dyspnea, generalized discomfort, and pain in the right lower chest wall.   Onset of illness was about 3 days ago, and since onset symptoms been progressive, with increasing generalized discomfort, no relief in spite of using albuterol at home. Patient continues to smoke, though she acknowledges importance of quitting, particularly given the today's illness. No confusion, no disorientation, no vomiting, no diarrhea. Since onset, no clear alleviating or exacerbating factors. Past Medical History:  Diagnosis Date  . Chronic knee pain   . GERD (gastroesophageal reflux disease)   . Heart murmur of newborn   . RA (rheumatoid arthritis) [facility])     Patient Active Problem List   Diagnosis Date Noted  . Tobacco use 04/20/2017  . Felon of finger of left hand   . Cellulitis of left hand s/p I and D 04/20/17 04/19/2017  . GERD (gastroesophageal reflux disease) 04/19/2017  . RA (rheumatoid arthritis) (HCC) 04/19/2017    Past Surgical History:  Procedure Laterality Date  . ABDOMINAL HYSTERECTOMY     total  . CHOLECYSTECTOMY    . FOOT SURGERY    . INCISION AND DRAINAGE Left 04/20/2017   Procedure: INCISION AND DRAINAGE LEFT THUMB;  Surgeon: Vickki Hearing, MD;  Location: [facility];  Service: Orthopedics;  Laterality: Left;  CULTURE ANEROBIC AEROBIC  . TUBAL LIGATION      OB History    No data available       Home Medications    Prior to Admission medications   Medication Sig Start Date End Date Taking? Authorizing Provider  albuterol (PROVENTIL HFA;VENTOLIN HFA) 108 (90 Base) MCG/ACT inhaler Inhale 1-2 puffs into the lungs every 6 (six) hours as needed for wheezing or shortness of  breath. 07/30/17   Liberty Handy, PA-C  predniSONE (DELTASONE) 20 MG tablet Take 2 tablets (40 mg total) by mouth daily with breakfast. For the next four days 09/01/17   Gerhard Munch, MD  traMADol (ULTRAM) 50 MG tablet Take 1 tablet (50 mg total) by mouth every 6 (six) hours as needed. 09/01/17   Gerhard Munch, MD    Family History Family History  Problem Relation Age of Onset  . Alzheimer's disease Mother   . Heart disease Father   . Arthritis Father        rheumatoid  . Arthritis Sister        rheumatoid  . CAD Sister        "Died of massive Heart attack at age 97"    Social History Social History   Tobacco Use  . Smoking status: Current Every Day Smoker    Packs/day: 0.50    Types: Cigarettes  . Smokeless tobacco: Never Used  Substance Use Topics  . Alcohol use: No  . Drug use: No     Allergies   Ciprofloxacin   Review of Systems Review of Systems  Constitutional:       Per HPI, otherwise negative  HENT:       Per HPI, otherwise negative  Respiratory:       Per HPI, otherwise negative  Cardiovascular:       Per HPI, otherwise negative  Gastrointestinal: Negative for vomiting.  Endocrine:  Negative aside from HPI  Genitourinary:       Neg aside from HPI   Musculoskeletal:       Per HPI, otherwise negative  Skin: Negative.   Neurological: Positive for weakness. Negative for syncope.     Physical Exam Updated Vital Signs BP 125/90   Pulse (!) 112   Temp 98.4 F (36.9 C) (Oral)   Resp 19   Ht 5' (1.524 m)   Wt 72.6 kg (160 lb)   SpO2 100%   BMI 31.25 kg/m   Physical Exam  Constitutional: She is oriented to person, place, and time. She appears well-developed and well-nourished. No distress.  HENT:  Head: Normocephalic and atraumatic.  Eyes: Conjunctivae and EOM are normal.  Cardiovascular: Normal rate and regular rhythm.  Pulmonary/Chest: She has decreased breath sounds.  Abdominal: She exhibits no distension.  Musculoskeletal:  She exhibits no edema.  Neurological: She is alert and oriented to person, place, and time. No cranial nerve deficit.  Skin: Skin is warm and dry.  Psychiatric: She has a normal mood and affect.  Nursing note and vitals reviewed.    ED Treatments / Results  Labs (all labs ordered are listed, but only abnormal results are displayed) Labs Reviewed - No data to display  EKG  EKG Interpretation  Date/Time:  Friday September 01 2017 15:40:27 EST Ventricular Rate:  76 PR Interval:    QRS Duration: 117 QT Interval:  382 QTC Calculation: 430 R Axis:   37 Text Interpretation:  Sinus rhythm Borderline short PR interval Incomplete right bundle branch block Low voltage, precordial leads Artifact Abnormal ekg Confirmed by Gerhard Munch [phone]) on 09/01/2017 3:49:06 PM       Radiology Dg Chest 2 View  Result Date: 09/01/2017 CLINICAL DATA:  Cough, RIGHT rib pain and shortness of breath, worsening shortness of breath today, smoker EXAM: CHEST  2 VIEW COMPARISON:  07/30/2017 FINDINGS: Normal heart size, mediastinal contours, and pulmonary vascularity. Minimal chronic peribronchial thickening with atelectasis versus scarring at lingula unchanged. No acute infiltrate, pleural effusion or pneumothorax. Bones demineralized. IMPRESSION: Minimal bronchitic changes and lingular atelectasis versus scarring. No acute abnormalities. Electronically Signed   By: Ulyses Southward M.D.   On: 09/01/2017 16:59    Procedures Procedures (including critical care time)  Medications Ordered in ED Medications  albuterol (PROVENTIL) (2.5 MG/3ML) 0.083% nebulizer solution 5 mg (5 mg Nebulization Not Given 09/01/17 1651)  albuterol (PROVENTIL,VENTOLIN) solution continuous neb (0 mg/hr Nebulization Stopped 09/01/17 1753)  albuterol (PROVENTIL HFA;VENTOLIN HFA) 108 (90 Base) MCG/ACT inhaler 2 puff (not administered)  ipratropium-albuterol (DUONEB) 0.5-2.5 (3) MG/3ML nebulizer solution (3 mLs  Given 09/01/17 1537)  sodium  chloride 0.9 % bolus 1,000 mL (0 mLs Intravenous Stopped 09/01/17 1753)  ketorolac (TORADOL) 30 MG/ML injection 15 mg (15 mg Intravenous Given 09/01/17 1611)  methylPREDNISolone sodium succinate (SOLU-MEDROL) 125 mg/2 mL injection 125 mg (125 mg Intravenous Given 09/01/17 1611)  traMADol (ULTRAM) tablet 50 mg (50 mg Oral Given 09/01/17 1653)     Initial Impression / Assessment and Plan / ED Course  I have reviewed the triage vital signs and the nursing notes.  Pertinent labs & imaging results that were available during my care of the patient were reviewed by me and considered in my medical decision making (see chart for details). Breathing treatment the patient remains dyspneic, with wheezing, has some right chest wall tenderness.    6:16 PM  Now after multiple breathing treatments, steroids, analgesia the patient appears better, has clear lung  sounds. She is not hypoxic, she has mild tachypnea, likely secondary to albuterol. She, her female companion and I discussed the importance of smoking cessation again, particularly given her youth, and abnormal chest x-ray consistent with COPD. With no persistent hypoxia, no evidence for pneumonia, no evidence for bacteremia, sepsis, patient was started on a course of steroids, scheduled albuterol, discharged in stable condition. Final Clinical Impressions(s) / ED Diagnoses   Final diagnoses:  Acute bronchitis, unspecified organism  COPD exacerbation [facility])    ED Discharge Orders        Ordered    traMADol (ULTRAM) 50 MG tablet  Every 6 hours PRN     09/01/17 1816    predniSONE (DELTASONE) 20 MG tablet  Daily with breakfast     09/01/17 1816       Gerhard Munch, MD 09/01/17 [phone]

## 2017-09-01 NOTE — ED Notes (Signed)
Continue to auscultate congestion in the right lung.  Pt continues to c/o  right rib pain.  Orders received from Dr. Jeraldine Loots .

## 2019-08-19 IMAGING — DX DG CHEST 2V
2 series · 2 of 2 positions shown · non-contrast
Comparison: April 28, 2015

CLINICAL DATA: Left-sided chest pain.  Productive cough.

EXAM:
CHEST  2 VIEW

[chest pa]
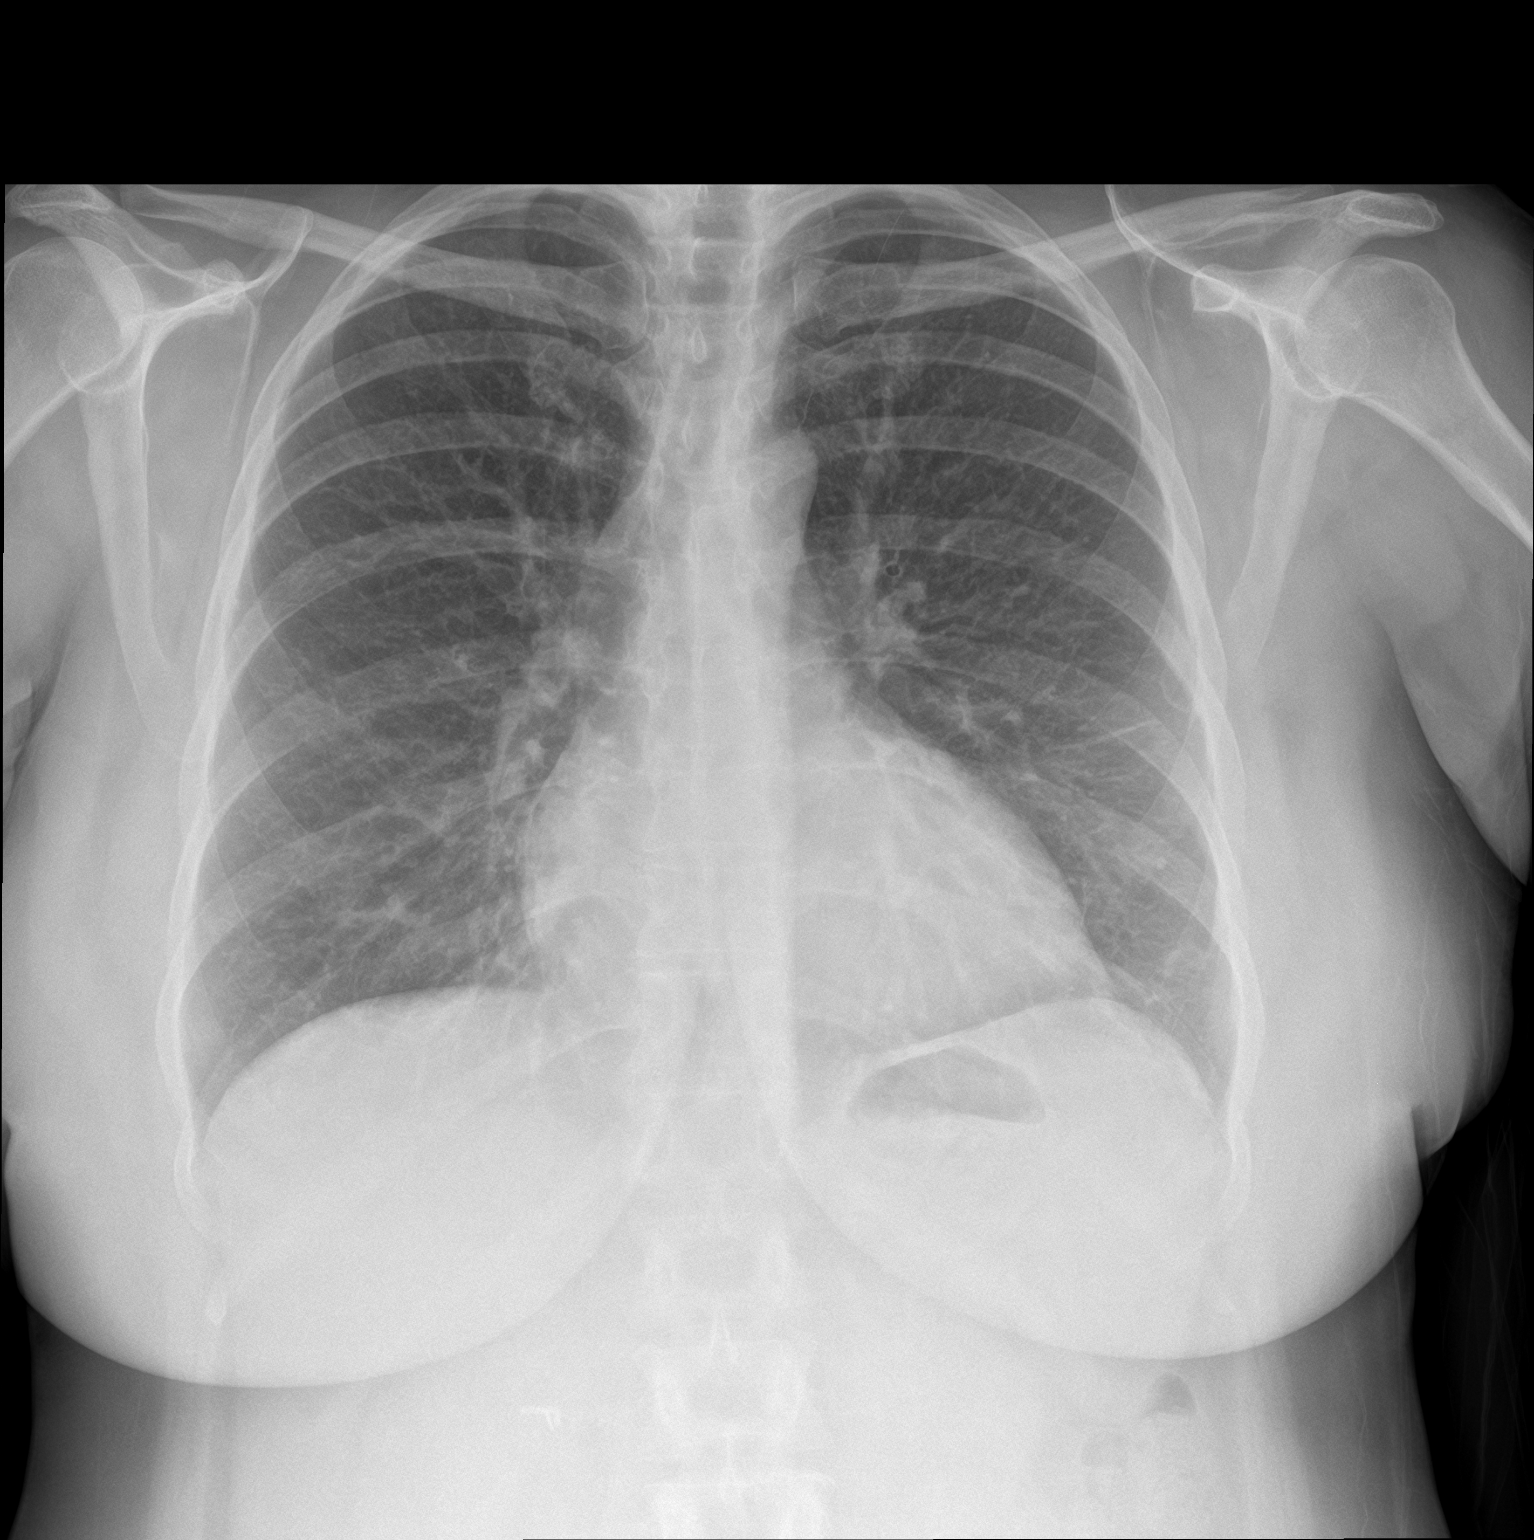

[chest lat]
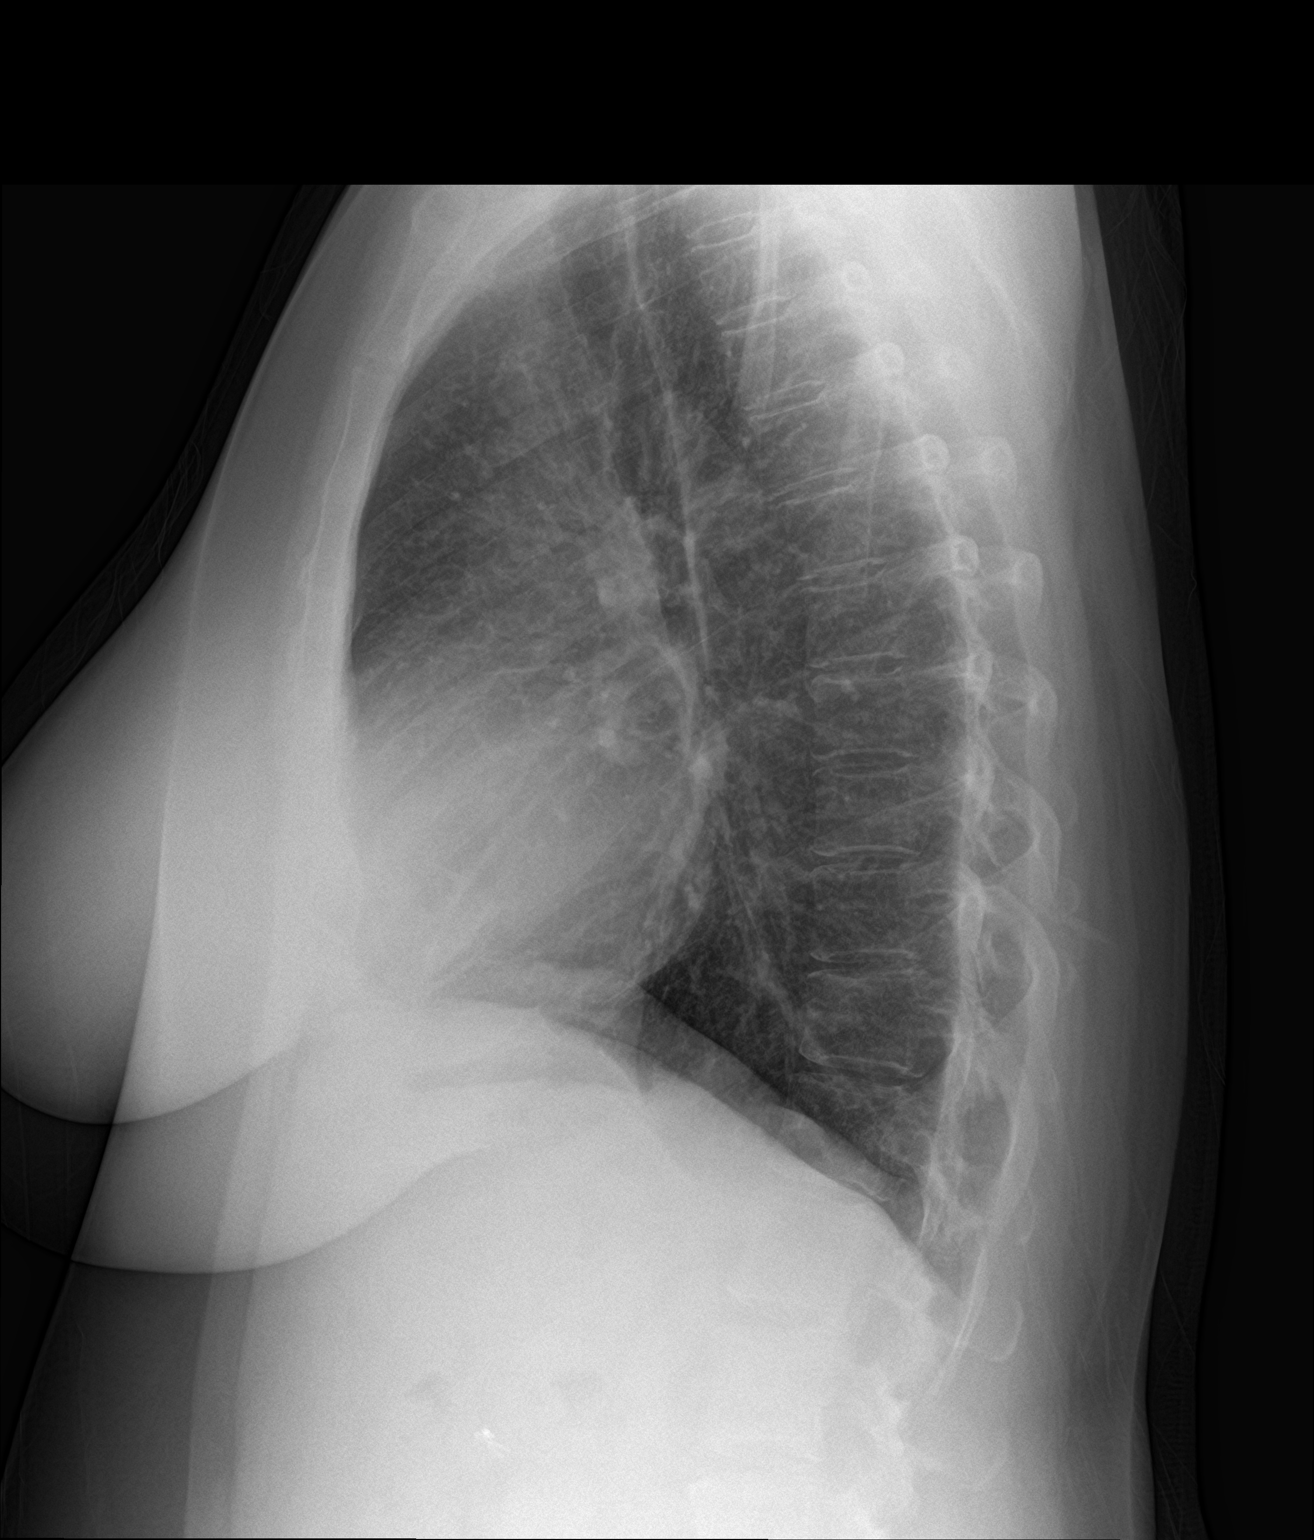

[2 of 2 positions shown; findings below may reference images not displayed]

FINDINGS: No pneumothorax. The heart, hila, and mediastinum are normal. No
nodules or masses. No focal infiltrate. Mild atelectasis in the left
base.
IMPRESSION: No active cardiopulmonary disease.

## 2019-10-10 ENCOUNTER — Emergency Department (HOSPITAL_COMMUNITY): Payer: No Typology Code available for payment source

## 2019-10-10 ENCOUNTER — Encounter (HOSPITAL_COMMUNITY): Payer: Self-pay

## 2019-10-10 ENCOUNTER — Emergency Department (HOSPITAL_COMMUNITY)
Admission: EM | Admit: 2019-10-10 | Discharge: 2019-10-10 | Disposition: A | Discharge status: Home / Self Care | Payer: No Typology Code available for payment source | Attending: Emergency Medicine | Admitting: Emergency Medicine

## 2019-10-10 ENCOUNTER — Other Ambulatory Visit: Payer: Self-pay

## 2019-10-10 DIAGNOSIS — F17210 Nicotine dependence, cigarettes, uncomplicated: Secondary | ICD-10-CM | POA: Diagnosis not present

## 2019-10-10 DIAGNOSIS — Z9049 Acquired absence of other specified parts of digestive tract: Secondary | ICD-10-CM | POA: Insufficient documentation

## 2019-10-10 DIAGNOSIS — M549 Dorsalgia, unspecified: Secondary | ICD-10-CM | POA: Insufficient documentation

## 2019-10-10 DIAGNOSIS — R932 Abnormal findings on diagnostic imaging of liver and biliary tract: Secondary | ICD-10-CM | POA: Insufficient documentation

## 2019-10-10 DIAGNOSIS — R079 Chest pain, unspecified: Secondary | ICD-10-CM | POA: Insufficient documentation

## 2019-10-10 DIAGNOSIS — M25512 Pain in left shoulder: Secondary | ICD-10-CM | POA: Diagnosis present

## 2019-10-10 DIAGNOSIS — R9389 Abnormal findings on diagnostic imaging of other specified body structures: Secondary | ICD-10-CM

## 2019-10-10 DIAGNOSIS — M25562 Pain in left knee: Secondary | ICD-10-CM | POA: Insufficient documentation

## 2019-10-10 DIAGNOSIS — M542 Cervicalgia: Secondary | ICD-10-CM | POA: Insufficient documentation

## 2019-10-10 LAB — CBC WITH DIFFERENTIAL/PLATELET
Abs Immature Granulocytes: 0.09 10*3/uL — ABNORMAL HIGH (ref 0.00–0.07)
Basophils Absolute: 0.1 10*3/uL (ref 0.0–0.1)
Basophils Relative: 1 %
Eosinophils Absolute: 0.2 10*3/uL (ref 0.0–0.5)
Eosinophils Relative: 2 %
HCT: 48.5 % — ABNORMAL HIGH (ref 36.0–46.0)
Hemoglobin: 16 g/dL — ABNORMAL HIGH (ref 12.0–15.0)
Immature Granulocytes: 1 %
Lymphocytes Relative: 38 %
Lymphs Abs: 4.4 10*3/uL — ABNORMAL HIGH (ref 0.7–4.0)
MCH: 32.5 pg (ref 26.0–34.0)
MCHC: 33 g/dL (ref 30.0–36.0)
MCV: 98.4 fL (ref 80.0–100.0)
Monocytes Absolute: 1 10*3/uL (ref 0.1–1.0)
Monocytes Relative: 8 %
Neutro Abs: 6 10*3/uL (ref 1.7–7.7)
Neutrophils Relative %: 50 %
Platelets: 258 10*3/uL (ref 150–400)
RBC: 4.93 MIL/uL (ref 3.87–5.11)
RDW: 13.6 % (ref 11.5–15.5)
WBC: 11.7 10*3/uL — ABNORMAL HIGH (ref 4.0–10.5)
nRBC: 0 % (ref 0.0–0.2)

## 2019-10-10 LAB — COMPREHENSIVE METABOLIC PANEL
ALT: 90 U/L — ABNORMAL HIGH (ref 0–44)
AST: 67 U/L — ABNORMAL HIGH (ref 15–41)
Albumin: 4.2 g/dL (ref 3.5–5.0)
Alkaline Phosphatase: 79 U/L (ref 38–126)
Anion gap: 7 (ref 5–15)
BUN: 13 mg/dL (ref 6–20)
CO2: 30 mmol/L (ref 22–32)
Calcium: 9 mg/dL (ref 8.9–10.3)
Chloride: 103 mmol/L (ref 98–111)
Creatinine, Ser: 0.76 mg/dL (ref 0.44–1.00)
GFR calc Af Amer: >60 mL/min (ref >60)
GFR calc non Af Amer: >60 mL/min (ref >60)
Glucose, Bld: 91 mg/dL (ref 70–99)
Potassium: 4.3 mmol/L (ref 3.5–5.1)
Sodium: 140 mmol/L (ref 135–145)
Total Bilirubin: 1 mg/dL (ref 0.3–1.2)
Total Protein: 8.2 g/dL — ABNORMAL HIGH (ref 6.5–8.1)

## 2019-10-10 LAB — URINALYSIS, ROUTINE W REFLEX MICROSCOPIC
Bilirubin Urine: NEGATIVE
Glucose, UA: NEGATIVE mg/dL
Hgb urine dipstick: NEGATIVE
Ketones, ur: NEGATIVE mg/dL
Leukocytes,Ua: NEGATIVE
Nitrite: NEGATIVE
Protein, ur: NEGATIVE mg/dL
Specific Gravity, Urine: 1.003 — ABNORMAL LOW (ref 1.005–1.030)
pH: 6 (ref 5.0–8.0)

## 2019-10-10 LAB — RAPID URINE DRUG SCREEN, HOSP PERFORMED
Amphetamines: NOT DETECTED
Barbiturates: NOT DETECTED
Benzodiazepines: NOT DETECTED
Cocaine: NOT DETECTED
Opiates: NOT DETECTED
Tetrahydrocannabinol: POSITIVE — AB

## 2019-10-10 LAB — ETHANOL: Alcohol, Ethyl (B): 107 mg/dL — ABNORMAL HIGH (ref <10)

## 2019-10-10 LAB — TYPE AND SCREEN
ABO/RH(D): A POS
Antibody Screen: NEGATIVE

## 2019-10-10 MED ORDER — NAPROXEN 250 MG PO TABS
500.0000 mg | ORAL_TABLET | Freq: Once | ORAL | Status: AC
  Administered 2019-10-10: 500 mg via ORAL
  Filled 2019-10-10: qty 2

## 2019-10-10 MED ORDER — FENTANYL CITRATE (PF) 100 MCG/2ML IJ SOLN
50.0000 ug | Freq: Once | INTRAMUSCULAR | Status: AC
  Administered 2019-10-10: 50 ug via INTRAVENOUS
  Filled 2019-10-10: qty 2

## 2019-10-10 MED ORDER — METHOCARBAMOL 500 MG PO TABS: 500.0000 mg | ORAL_TABLET | Freq: Two times a day (BID) | ORAL | 0 refills | Status: AC | PRN

## 2019-10-10 MED ORDER — NAPROXEN 500 MG PO TABS: 500.0000 mg | ORAL_TABLET | Freq: Two times a day (BID) | ORAL | 0 refills | Status: AC

## 2019-10-10 MED ORDER — IOHEXOL 300 MG/ML  SOLN
100.0000 mL | Freq: Once | INTRAMUSCULAR | Status: AC | PRN
  Administered 2019-10-10: 100 mL via INTRAVENOUS
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
## 2019-10-15 ENCOUNTER — Emergency Department (HOSPITAL_COMMUNITY)
Admission: EM | Admit: 2019-10-15 | Discharge: 2019-10-15 | Disposition: A | Discharge status: Home / Self Care | Payer: No Typology Code available for payment source | Attending: Emergency Medicine | Admitting: Emergency Medicine

## 2019-10-15 ENCOUNTER — Encounter (HOSPITAL_COMMUNITY): Payer: Self-pay | Admitting: Emergency Medicine

## 2019-10-15 ENCOUNTER — Emergency Department (HOSPITAL_COMMUNITY): Payer: No Typology Code available for payment source

## 2019-10-15 ENCOUNTER — Other Ambulatory Visit: Payer: Self-pay

## 2019-10-15 DIAGNOSIS — M069 Rheumatoid arthritis, unspecified: Secondary | ICD-10-CM | POA: Diagnosis not present

## 2019-10-15 DIAGNOSIS — Z79899 Other long term (current) drug therapy: Secondary | ICD-10-CM | POA: Diagnosis not present

## 2019-10-15 DIAGNOSIS — R0789 Other chest pain: Secondary | ICD-10-CM

## 2019-10-15 DIAGNOSIS — F17210 Nicotine dependence, cigarettes, uncomplicated: Secondary | ICD-10-CM | POA: Diagnosis not present

## 2019-10-15 MED ORDER — STERILE WATER FOR INJECTION IJ SOLN
INTRAMUSCULAR | Status: AC
  Filled 2019-10-15: qty 10

## 2019-10-15 MED ORDER — CYCLOBENZAPRINE HCL 10 MG PO TABS
10.0000 mg | ORAL_TABLET | Freq: Once | ORAL | Status: AC
  Administered 2019-10-15: 10 mg via ORAL
  Filled 2019-10-15: qty 1

## 2019-10-15 MED ORDER — HYDROCODONE-ACETAMINOPHEN 5-325 MG PO TABS: 1.0000 | ORAL_TABLET | Freq: Four times a day (QID) | ORAL | 0 refills | Status: AC | PRN

## 2019-10-15 MED ORDER — LIDOCAINE 4 % EX CREA: 1.0000 "application " | TOPICAL_CREAM | Freq: Three times a day (TID) | CUTANEOUS | 0 refills | Status: AC | PRN

## 2019-10-15 MED ORDER — ZIPRASIDONE MESYLATE 20 MG IM SOLR
INTRAMUSCULAR | Status: AC
  Filled 2019-10-15: qty 20

## 2019-10-15 MED ORDER — IBUPROFEN 600 MG PO TABS: 600.0000 mg | ORAL_TABLET | Freq: Four times a day (QID) | ORAL | 0 refills | Status: AC | PRN

## 2019-10-15 MED ORDER — HYDROCODONE-ACETAMINOPHEN 5-325 MG PO TABS
1.0000 | ORAL_TABLET | Freq: Once | ORAL | Status: AC
Start: 2019-10-15 — End: 2019-10-15
  Administered 2019-10-15: 19:00:00 1 via ORAL
  Filled 2019-10-15: qty 1

## 2019-10-15 MED ORDER — CYCLOBENZAPRINE HCL 10 MG PO TABS: 10.0000 mg | ORAL_TABLET | Freq: Two times a day (BID) | ORAL | 0 refills | Status: AC | PRN

## 2019-10-15 NOTE — ED Notes (Signed)
Pt c/o of LEFT sided rib pain at this time,
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
## 2019-10-15 NOTE — ED Notes (Signed)
Large ace wrap applied to lower ribs
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
## 2019-10-15 NOTE — Discharge Instructions (Addendum)
1. Medications: Alternate 600 mg of ibuprofen and [phone] mg of Tylenol every 3 hours as needed for pain. Do not exceed 4000 mg of Tylenol daily.  Take ibuprofen with food to avoid upset stomach issues.  You can take hydrocodone as needed for severe pain but do not drive, drink alcohol, or operate heavy machinery while taking this medicine as it can cause drowsiness.  Be aware this medicine also contains Tylenol and do not exceed more than 4000 mg of Tylenol daily.  You can also take Flexeril which is a muscle relaxer but it can also cause drowsiness so the same precautions apply.  Apply lidocaine cream to areas of pain.  2. Treatment: rest, apply heat 20 minutes at a time a few times daily, massage to the area and do some gentle stretching to avoid muscle stiffness.  Use the incentive spirometer at least 3 times daily.  3. Follow Up: Please followup with your PCP in 1 week if no improvement for discussion of your diagnoses and further evaluation after today's visit; if you do not have a primary care doctor use the resource guide provided to find one; Please return to the ER for worsening symptoms or other concerns such as fever, severe cough or shortness of breath, worsening pains, persistent vomiting, or loss of consciousness
